# Patient Record
Sex: Female | Born: 1975 | Race: Black or African American | Hispanic: No | Marital: Married | Smoking: Never smoker
Health system: Southern US, Community
[De-identification: ages and names within clinical notes are randomized; demographics above are authoritative.]

## PROBLEM LIST (undated history)

## (undated) ENCOUNTER — Inpatient Hospital Stay (HOSPITAL_COMMUNITY): Payer: Self-pay

## (undated) DIAGNOSIS — E785 Hyperlipidemia, unspecified: Secondary | ICD-10-CM

## (undated) DIAGNOSIS — Z8742 Personal history of other diseases of the female genital tract: Secondary | ICD-10-CM

## (undated) DIAGNOSIS — O364XX0 Maternal care for intrauterine death, not applicable or unspecified: Secondary | ICD-10-CM

## (undated) DIAGNOSIS — N879 Dysplasia of cervix uteri, unspecified: Secondary | ICD-10-CM

## (undated) DIAGNOSIS — O4400 Placenta previa specified as without hemorrhage, unspecified trimester: Secondary | ICD-10-CM

## (undated) DIAGNOSIS — D649 Anemia, unspecified: Secondary | ICD-10-CM

## (undated) DIAGNOSIS — R87629 Unspecified abnormal cytological findings in specimens from vagina: Secondary | ICD-10-CM

## (undated) DIAGNOSIS — D219 Benign neoplasm of connective and other soft tissue, unspecified: Secondary | ICD-10-CM

## (undated) DIAGNOSIS — D75839 Thrombocytosis, unspecified: Secondary | ICD-10-CM

## (undated) DIAGNOSIS — D473 Essential (hemorrhagic) thrombocythemia: Secondary | ICD-10-CM

## (undated) DIAGNOSIS — N390 Urinary tract infection, site not specified: Secondary | ICD-10-CM

## (undated) HISTORY — PX: EYE SURGERY: SHX253

## (undated) HISTORY — DX: Dysplasia of cervix uteri, unspecified: N87.9

## (undated) HISTORY — PX: LASIK: SHX215

## (undated) HISTORY — DX: Maternal care for intrauterine death, not applicable or unspecified: O36.4XX0

## (undated) HISTORY — DX: Personal history of other diseases of the female genital tract: Z87.42

## (undated) HISTORY — PX: WISDOM TOOTH EXTRACTION: SHX21

---

## 2004-08-30 ENCOUNTER — Emergency Department (HOSPITAL_COMMUNITY): Admission: EM | Admit: 2004-08-30 | Discharge: 2004-08-30 | Payer: Self-pay | Admitting: Emergency Medicine

## 2005-07-18 ENCOUNTER — Other Ambulatory Visit: Admission: RE | Admit: 2005-07-18 | Discharge: 2005-07-18 | Payer: Self-pay | Admitting: Obstetrics and Gynecology

## 2005-11-12 ENCOUNTER — Inpatient Hospital Stay (HOSPITAL_COMMUNITY): Admission: AD | Admit: 2005-11-12 | Discharge: 2005-11-12 | Payer: Self-pay | Admitting: Obstetrics & Gynecology

## 2005-11-12 ENCOUNTER — Emergency Department (HOSPITAL_COMMUNITY): Admission: EM | Admit: 2005-11-12 | Discharge: 2005-11-12 | Payer: Self-pay | Admitting: Emergency Medicine

## 2005-11-13 ENCOUNTER — Inpatient Hospital Stay (HOSPITAL_COMMUNITY): Admission: AD | Admit: 2005-11-13 | Discharge: 2005-11-13 | Payer: Self-pay | Admitting: Obstetrics and Gynecology

## 2006-02-15 ENCOUNTER — Inpatient Hospital Stay (HOSPITAL_COMMUNITY): Admission: AD | Admit: 2006-02-15 | Discharge: 2006-02-18 | Payer: Self-pay | Admitting: Obstetrics and Gynecology

## 2008-02-20 ENCOUNTER — Ambulatory Visit: Payer: Self-pay | Admitting: Dermatology

## 2008-12-01 ENCOUNTER — Ambulatory Visit: Payer: Self-pay | Admitting: Physician Assistant

## 2009-02-24 ENCOUNTER — Ambulatory Visit: Payer: Self-pay | Admitting: Family Medicine

## 2009-04-16 HISTORY — PX: COLPOSCOPY: SHX161

## 2010-04-16 HISTORY — PX: LASIK: SHX215

## 2014-08-11 ENCOUNTER — Ambulatory Visit (INDEPENDENT_AMBULATORY_CARE_PROVIDER_SITE_OTHER): Payer: Self-pay | Admitting: Family Medicine

## 2014-08-11 VITALS — BP 116/74 | HR 86 | Temp 98.8°F | Resp 18 | Ht 64.0 in | Wt 139.0 lb

## 2014-08-11 DIAGNOSIS — Z029 Encounter for administrative examinations, unspecified: Secondary | ICD-10-CM

## 2014-08-11 DIAGNOSIS — Z111 Encounter for screening for respiratory tuberculosis: Secondary | ICD-10-CM

## 2014-08-11 DIAGNOSIS — IMO0002 Reserved for concepts with insufficient information to code with codable children: Secondary | ICD-10-CM

## 2014-08-11 NOTE — Progress Notes (Signed)
Subjective:  This chart was scribed for Tamara Ray, MD by [facility], medical scribe at [facility].The patient was seen in exam room 05 and the patient's care was started at 6:33 PM.   Patient ID: Tamara Lee, female    DOB: Oct 04, 1975, 39 y.o.   MRN: [id number] Chief Complaint  Patient presents with  . Annual Exam    has form    HPI HPI Comments: Tamara Lee is a 39 y.o. female who presents to [facility] for an employment physical. She needs a TB skin test, TB skin screening test is low risk. 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  There are no active problems to display for this patient.  History reviewed. No pertinent past medical history. Past Surgical History  Procedure Laterality Date  . Fracture surgery    . Cesarean section     No Known Allergies Prior to Admission medications   Not on File   History   Social History  . Marital Status: Single    Spouse Name: N/A  . Number of Children: N/A  . Years of Education: N/A   Occupational History  . Not on file.   Social History Main Topics  . Smoking status: Never Smoker   . Smokeless tobacco: Not on file  . Alcohol Use: No  . Drug Use: No  . Sexual Activity: Not on file   Other Topics Concern  . Not on file   Social History Narrative  . No narrative on file   Review of Systems 13 point ROS reviewed in patient survey, negative other than listed above or in reviewed nursing note.     Objective:  BP 116/74 mmHg  Pulse 86  Temp(Src) 98.8 F (37.1 C) (Oral)  Resp 18  Ht 5\' 4"  (1.626 m)  Wt 139 lb (63.05 kg)  BMI 23.85 kg/m2  SpO2 96%  LMP 07/07/2014 Physical Exam  Constitutional: She is oriented to person, place, and time. She appears well-developed and well-nourished. No distress.  HENT:  Head: Normocephalic and atraumatic.  Right Ear: External ear normal.  Left Ear: External  ear normal.  Mouth/Throat: Oropharynx is clear and moist.  Eyes: Conjunctivae are normal. Pupils are equal, round, and reactive to light.  Neck: Normal range of motion. Neck supple. No thyromegaly present.  Cardiovascular: Normal rate, regular rhythm, normal heart sounds and intact distal pulses.   No murmur heard. Pulmonary/Chest: Effort normal and breath sounds normal. No respiratory distress. She has no wheezes.  Abdominal: Soft. Bowel sounds are normal. There is no tenderness.  Musculoskeletal: Normal range of motion. She exhibits no edema or tenderness.       Thoracic back: Normal.       Lumbar back: Normal.  Lymphadenopathy:    She has no cervical adenopathy.  Neurological: She is alert and oriented to person, place, and time.  Skin: Skin is warm and dry. No rash noted.  Psychiatric: She has a normal mood and affect. Her behavior is normal. Thought content normal.  Nursing note and vitals reviewed.     Assessment & Plan:   Tamara Lee is a 39 y.o. female Examination for daycare employee.  -no concerns on hx or exam.  See paperwork - cleared for work.   Screening for tuberculosis - Plan: TB Skin Test obtained.  Read in 48-72 hrs.    No orders  of the defined types were placed in this encounter.   Patient Instructions  Return Saturday for tb skin test reading.     I personally performed the services described in this documentation, which was scribed in my presence. The recorded information has been reviewed and considered, and addended by me as needed.

## 2014-08-11 NOTE — Progress Notes (Signed)

## 2014-08-11 NOTE — Patient Instructions (Signed)
Return Saturday for tb skin test reading.

## 2014-08-14 ENCOUNTER — Ambulatory Visit (INDEPENDENT_AMBULATORY_CARE_PROVIDER_SITE_OTHER): Payer: Self-pay | Admitting: *Deleted

## 2014-08-14 DIAGNOSIS — Z111 Encounter for screening for respiratory tuberculosis: Secondary | ICD-10-CM

## 2014-08-14 LAB — TB SKIN TEST
INDURATION: 0 mm
TB Skin Test: NEGATIVE

## 2014-08-14 NOTE — Progress Notes (Signed)
Patient here today for TB read. Results negative 72mm.

## 2014-08-14 NOTE — Progress Notes (Deleted)
   Subjective:    Patient ID: Tamara Lee, female    DOB: 1976/04/04, 39 y.o.   MRN: [id number]  HPI    Review of Systems     Objective:   Physical Exam        Assessment & Plan:

## 2015-09-06 ENCOUNTER — Ambulatory Visit (INDEPENDENT_AMBULATORY_CARE_PROVIDER_SITE_OTHER): Payer: BLUE CROSS/BLUE SHIELD | Admitting: Physician Assistant

## 2015-09-06 VITALS — BP 107/66 | HR 84 | Temp 98.1°F | Resp 16 | Ht 64.0 in | Wt 151.0 lb

## 2015-09-06 DIAGNOSIS — J302 Other seasonal allergic rhinitis: Secondary | ICD-10-CM | POA: Diagnosis not present

## 2015-09-06 NOTE — Patient Instructions (Signed)
     IF you received an x-ray today, you will receive an invoice from [facility]. Please contact [facility] at [phone] with questions or concerns regarding your invoice.   IF you received labwork today, you will receive an invoice from [facility]. Please contact [facility] at [phone] with questions or concerns regarding your invoice.   Our billing staff will not be able to assist you with questions regarding bills from these companies.  You will be contacted with the lab results as soon as they are available. The fastest way to get your results is to activate your My Chart account. Instructions are located on the last page of this paperwork. If you have not heard from us regarding the results in 2 weeks, please contact this office.      

## 2015-09-06 NOTE — Progress Notes (Signed)
   09/06/2015 4:30 PM   DOB: [birth year] / MRN: [id number]  SUBJECTIVE:  Tamara Lee is a 40 y.o. female presenting for cough and nasal congestion that has been present for three weeks.  She is pregnant with her second child and this is a planned pregnancy.  She denies fever and is eating and drinking normally for her.   She has No Known Allergies.   She  has no past medical history on file.    She  reports that she has never smoked. She has never used smokeless tobacco. She reports that she does not drink alcohol or use illicit drugs. She  has no sexual activity history on file. The patient  has past surgical history that includes Fracture surgery and Cesarean section.  Her family history includes Hypertension in her brother and father.  Review of Systems  Constitutional: Negative for fever.  HENT: Negative for sore throat.   Cardiovascular: Negative for chest pain.  Gastrointestinal: Negative for heartburn.  Genitourinary: Negative for dysuria.  Musculoskeletal: Negative for myalgias.  Skin: Negative for itching and rash.  Neurological: Negative for dizziness and headaches.  Psychiatric/Behavioral: Negative for depression.    Problem list and medications reviewed and updated by myself where necessary, and exist elsewhere in the encounter.   OBJECTIVE:  BP 107/66 mmHg  Pulse 84  Temp(Src) 98.1 F (36.7 C) (Oral)  Resp 16  Ht 5\' 4"  (1.626 m)  Wt 151 lb (68.493 kg)  BMI 25.91 kg/m2  SpO2 99%  LMP 07/18/2015 (Exact Date)  Physical Exam  Constitutional: She is oriented to person, place, and time. She appears well-nourished. No distress.  Eyes: EOM are normal. Pupils are equal, round, and reactive to light.  Cardiovascular: Normal rate.   Pulmonary/Chest: Effort normal.  Abdominal: She exhibits no distension.  Neurological: She is alert and oriented to person, place, and time. No cranial nerve deficit. Gait normal.  Skin: Skin is dry. She is not diaphoretic.    Psychiatric: She has a normal mood and affect.  Vitals reviewed.   No results found for this or any previous visit (from the past 72 hour(s)).  No results found.  ASSESSMENT AND PLAN  [name] was seen today for sinus problem.  Diagnoses and all orders for this visit:  Seasonal allergies Comments: Advised she avoid NSAIDS.  Zyrtec or Claritin and Tylenol at OTC doses are okay.  Advised she ask OB/SYN about pseudoephedrine.    The patient was advised to call or return to clinic if she does not see an improvement in symptoms or to seek the care of the closest emergency department if she worsens with the above plan.   Philis Fendt, MHS, PA-C [facility] 09/06/2015 4:30 PM

## 2015-12-14 ENCOUNTER — Encounter (HOSPITAL_COMMUNITY): Payer: Self-pay | Admitting: *Deleted

## 2015-12-14 ENCOUNTER — Observation Stay (HOSPITAL_COMMUNITY)
Admission: AD | Admit: 2015-12-14 | Discharge: 2015-12-15 | Disposition: A | Discharge status: Home / Self Care | Payer: BLUE CROSS/BLUE SHIELD | Source: Ambulatory Visit | Attending: Obstetrics and Gynecology | Admitting: Obstetrics and Gynecology

## 2015-12-14 DIAGNOSIS — O364XX0 Maternal care for intrauterine death, not applicable or unspecified: Principal | ICD-10-CM | POA: Insufficient documentation

## 2015-12-14 DIAGNOSIS — Z3A20 20 weeks gestation of pregnancy: Secondary | ICD-10-CM | POA: Diagnosis not present

## 2015-12-14 DIAGNOSIS — Z3490 Encounter for supervision of normal pregnancy, unspecified, unspecified trimester: Secondary | ICD-10-CM

## 2015-12-14 DIAGNOSIS — O4290 Premature rupture of membranes, unspecified as to length of time between rupture and onset of labor, unspecified weeks of gestation: Secondary | ICD-10-CM | POA: Diagnosis present

## 2015-12-14 LAB — CBC
HEMATOCRIT: 30.3 % — AB (ref 36.0–46.0)
HEMOGLOBIN: 10.5 g/dL — AB (ref 12.0–15.0)
MCH: 30.1 pg (ref 26.0–34.0)
MCHC: 34.7 g/dL (ref 30.0–36.0)
MCV: 86.8 fL (ref 78.0–100.0)
Platelets: 310 10*3/uL (ref 150–400)
RBC: 3.49 MIL/uL — AB (ref 3.87–5.11)
RDW: 13.6 % (ref 11.5–15.5)
WBC: 17.4 10*3/uL — AB (ref 4.0–10.5)

## 2015-12-14 LAB — TYPE AND SCREEN
ABO/RH(D): A POS
ANTIBODY SCREEN: NEGATIVE

## 2015-12-14 LAB — OB RESULTS CONSOLE RUBELLA ANTIBODY, IGM: Rubella: IMMUNE

## 2015-12-14 LAB — OB RESULTS CONSOLE ABO/RH: RH Type: POSITIVE

## 2015-12-14 LAB — OB RESULTS CONSOLE RPR: RPR: NONREACTIVE

## 2015-12-14 LAB — OB RESULTS CONSOLE GC/CHLAMYDIA
CHLAMYDIA, DNA PROBE: NEGATIVE
GC PROBE AMP, GENITAL: NEGATIVE

## 2015-12-14 LAB — OB RESULTS CONSOLE HIV ANTIBODY (ROUTINE TESTING): HIV: NONREACTIVE

## 2015-12-14 LAB — OB RESULTS CONSOLE HEPATITIS B SURFACE ANTIGEN: Hepatitis B Surface Ag: NEGATIVE

## 2015-12-14 LAB — OB RESULTS CONSOLE ANTIBODY SCREEN: ANTIBODY SCREEN: NEGATIVE

## 2015-12-14 LAB — ABO/RH: ABO/RH(D): A POS

## 2015-12-14 MED ORDER — OXYCODONE-ACETAMINOPHEN 5-325 MG PO TABS: 1.0000 | ORAL_TABLET | ORAL | Status: DC | PRN

## 2015-12-14 MED ORDER — LACTATED RINGERS IV SOLN
INTRAVENOUS | Status: DC
  Administered 2015-12-14 – 2015-12-15 (×4): via INTRAVENOUS

## 2015-12-14 MED ORDER — LIDOCAINE HCL (PF) 1 % IJ SOLN: 30.0000 mL | INTRAMUSCULAR | Status: DC | PRN

## 2015-12-14 MED ORDER — FLEET ENEMA 7-19 GM/118ML RE ENEM: 1.0000 | ENEMA | RECTAL | Status: DC | PRN

## 2015-12-14 MED ORDER — OXYTOCIN BOLUS FROM INFUSION: 500.0000 mL | Freq: Once | INTRAVENOUS | Status: DC

## 2015-12-14 MED ORDER — ONDANSETRON HCL 4 MG/2ML IJ SOLN: 4.0000 mg | Freq: Four times a day (QID) | INTRAMUSCULAR | Status: DC | PRN

## 2015-12-14 MED ORDER — LACTATED RINGERS IV SOLN: 500.0000 mL | INTRAVENOUS | Status: DC | PRN

## 2015-12-14 MED ORDER — OXYCODONE-ACETAMINOPHEN 5-325 MG PO TABS: 2.0000 | ORAL_TABLET | ORAL | Status: DC | PRN

## 2015-12-14 MED ORDER — ACETAMINOPHEN 325 MG PO TABS: 650.0000 mg | ORAL_TABLET | ORAL | Status: DC | PRN

## 2015-12-14 MED ORDER — BUTORPHANOL TARTRATE 1 MG/ML IJ SOLN
1.0000 mg | Freq: Once | INTRAMUSCULAR | Status: AC
  Administered 2015-12-14: 1 mg via INTRAVENOUS
  Filled 2015-12-14: qty 1

## 2015-12-14 MED ORDER — MISOPROSTOL 50MCG HALF TABLET
100.0000 ug | ORAL_TABLET | ORAL | Status: AC
  Administered 2015-12-14 – 2015-12-15 (×3): 100 ug via VAGINAL
  Filled 2015-12-14 (×3): qty 1

## 2015-12-14 MED ORDER — CEFAZOLIN IN D5W 1 GM/50ML IV SOLN
1.0000 g | Freq: Three times a day (TID) | INTRAVENOUS | Status: DC
  Administered 2015-12-14 – 2015-12-15 (×3): 1 g via INTRAVENOUS
  Filled 2015-12-14 (×4): qty 50

## 2015-12-14 MED ORDER — LACTATED RINGERS IV SOLN: INTRAVENOUS | Status: DC

## 2015-12-14 MED ORDER — PROMETHAZINE HCL 25 MG/ML IJ SOLN
12.5000 mg | Freq: Once | INTRAMUSCULAR | Status: AC
  Administered 2015-12-14: 12.5 mg via INTRAVENOUS
  Filled 2015-12-14: qty 1

## 2015-12-14 MED ORDER — PROMETHAZINE HCL 25 MG PO TABS: 12.5000 mg | ORAL_TABLET | Freq: Once | ORAL | Status: AC

## 2015-12-14 MED ORDER — SOD CITRATE-CITRIC ACID 500-334 MG/5ML PO SOLN: 30.0000 mL | ORAL | Status: DC | PRN

## 2015-12-14 MED ORDER — SOD CITRATE-CITRIC ACID 500-334 MG/5ML PO SOLN
30.0000 mL | ORAL | Status: DC | PRN
  Administered 2015-12-15: 30 mL via ORAL
  Filled 2015-12-14: qty 15

## 2015-12-14 MED ORDER — PROMETHAZINE HCL 25 MG PO TABS: 12.5000 mg | ORAL_TABLET | Freq: Once | ORAL | Status: DC

## 2015-12-14 MED ORDER — OXYTOCIN 40 UNITS IN LACTATED RINGERS INFUSION - SIMPLE MED
2.5000 [IU]/h | INTRAVENOUS | Status: DC
  Filled 2015-12-14: qty 1000

## 2015-12-14 MED ORDER — OXYTOCIN 40 UNITS IN LACTATED RINGERS INFUSION - SIMPLE MED: 2.5000 [IU]/h | INTRAVENOUS | Status: DC

## 2015-12-14 NOTE — H&P (Signed)
NAME:  Tamara Lee, Tamara Lee NO.:  [id number]  MEDICAL RECORD NO.:  [id number]  LOCATION:                                 FACILITY:  PHYSICIAN:  Ralene Bathe. Matthew Saras, M.D.DATE OF BIRTH:  07-06-1975  DATE OF ADMISSION:  12/14/2015 DATE OF DISCHARGE:                             HISTORY & PHYSICAL   CHIEF COMPLAINT:  IUFD at 20+ weeks' gestation.  HPI:  A 40 year old, G2, P1, 1st pregnancy delivered by cesarean, 9 pounds 2 ounce female in 2007.  She had an uneventful pregnancy with a normal Panorama female karyotype and did not experience problems until she presented 8/28 to the office with a diagnosis of PPROM, previable. Several options discussed with her at that point.  The cervix was 1.6 cm with funneling.  AFI was way less than the 3rd percentile.  She was given options of expected management versus hospitalization and was pursuing modified rest at home checking her temp with planned follow up in the office today.  She presented 2 days later, 8/30 earlier today. Temp 98.7.  No FHR noted.  Ultrasound confirmed no FHR.  Presents now for management.  IUFD at 77 weeks.  The process of Cytotec induction explained to her.  We will go ahead and start prophylactic antibiotics, discussed further evaluation options as far as placental pathology autopsy, and chromosomes have already been performed.  We will check antibody titers for infectious causes also.  Blood type is A positive.  PAST MEDICAL HISTORY:  Please see the [facility] form for details of her PMH.  PHYSICAL EXAM:  VITAL SIGNS:  Temp 98.7, blood pressure 120/78. HEENT:  Unremarkable. NECK:  Supple without masses. LUNGS:  Clear. CARDIOVASCULAR:  Regular rate and rhythm.  No murmurs, rubs, gallops. BREASTS:  Not examined. PELVIS:  Fundus at the umbilicus.  Nontender.  No FHR noted.  Cervix will be examined in Labor and Delivery. EXTREMITIES:  Unremarkable. NEUROLOGIC:  Unremarkable  IMPRESSION:  A 20+ week  IUFD/PPROM.  PLAN:  Cytotec labor induction.     Muzammil Bruins M. Matthew Saras, M.D.   ______________________________ Ralene Bathe. Matthew Saras, M.D.    RM[facility]/MEDQ  D:  12/14/2015  T:  12/14/2015  Job:  [id number]

## 2015-12-14 NOTE — Anesthesia Pain Management Evaluation Note (Signed)
  CRNA Pain Management Visit Note  Patient: Tamara Lee, 40 y.o., female  "Hello I am a member of the anesthesia team at [facility]. We have an anesthesia team available at all times to provide care throughout the hospital, including epidural management and anesthesia for C-section. I don't know your plan for the delivery whether it a natural birth, water birth, IV sedation, nitrous supplementation, doula or epidural, but we want to meet your pain goals."   1.Was your pain managed to your expectations on prior hospitalizations?   Unable to assess at this time  2.What is your expectation for pain management during this hospitalization?     Unable to assess at this time. Per RN fetal demise and mother quite emotional right now, asking for epidural to be placed. Will forego OB Anesthesia Consult at this time and inform Anesthesiologist regarding patient request and current status.   3.How can we help you reach that goal?  Record the patient's initial score and the patient's pain goal.   Pain: Foregoing  Assessment at this time per RN request.  Pain Goal: Minimal, with epidural placement requested [facility] wants you to be able to say your pain was always managed very well.  [facility] 12/14/2015

## 2015-12-14 NOTE — Progress Notes (Signed)
I spent time with pt and her family to offer bereavement support.  Pt is emotionally stoic from a place of strength.  Her parents were with her and her husband had just left to pick up their 40 year-old son.  I was in the room when she talked on the phone to her son and told him that his baby sister "wasn't going to make it." He was also very stoic on the phone. I offered pastoral presence and reflective listening.  They are aware of our ongoing availability for support, but please also page as needs arise.   [place], Bcc Pager, [phone] 5:25 PM    12/14/15 1700  Clinical Encounter Type  Visited With Patient and family together  Visit Type Initial  Referral From Nurse  Consult/Referral To Chaplain

## 2015-12-14 NOTE — H&P (Signed)
Tamara Lee  DICTATION # [id number] CSN# [id number]   Margarette Asal, MD 12/14/2015 1:42 PM

## 2015-12-15 ENCOUNTER — Observation Stay (HOSPITAL_COMMUNITY): Payer: BLUE CROSS/BLUE SHIELD | Admitting: Anesthesiology

## 2015-12-15 ENCOUNTER — Encounter (HOSPITAL_COMMUNITY): Payer: Self-pay | Admitting: Anesthesiology

## 2015-12-15 ENCOUNTER — Encounter (HOSPITAL_COMMUNITY)
Admission: AD | Disposition: A | Discharge status: Home / Self Care | Payer: Self-pay | Source: Ambulatory Visit | Attending: Obstetrics and Gynecology

## 2015-12-15 DIAGNOSIS — O364XX0 Maternal care for intrauterine death, not applicable or unspecified: Secondary | ICD-10-CM | POA: Diagnosis not present

## 2015-12-15 HISTORY — PX: DILATION AND CURETTAGE OF UTERUS: SHX78

## 2015-12-15 HISTORY — DX: Maternal care for intrauterine death, not applicable or unspecified: O36.4XX0

## 2015-12-15 LAB — INFECT DISEASE AB IGM REFLEX 1

## 2015-12-15 LAB — TORCH-IGM(TOXO/ RUB/ CMV/ HSV) W TITER
CMV IgM: <30 AU/mL (ref 0.0–29.9)
Rubella IgM: <20 AU/mL (ref 0.0–19.9)
Toxoplasma Antibody- IgM: <3 AU/mL (ref 0.0–7.9)

## 2015-12-15 LAB — RPR: RPR: NONREACTIVE

## 2015-12-15 SURGERY — DILATION AND CURETTAGE
Anesthesia: Choice

## 2015-12-15 SURGERY — DILATION AND CURETTAGE
Anesthesia: Spinal | Site: Vagina

## 2015-12-15 MED ORDER — FENTANYL CITRATE (PF) 100 MCG/2ML IJ SOLN
INTRAMUSCULAR | Status: DC | PRN
  Administered 2015-12-15 (×2): 50 ug via INTRAVENOUS

## 2015-12-15 MED ORDER — MIDAZOLAM HCL 5 MG/5ML IJ SOLN
INTRAMUSCULAR | Status: DC | PRN
  Administered 2015-12-15: 2 mg via INTRAVENOUS

## 2015-12-15 MED ORDER — IBUPROFEN 600 MG PO TABS
600.0000 mg | ORAL_TABLET | Freq: Once | ORAL | Status: AC
  Administered 2015-12-15: 600 mg via ORAL

## 2015-12-15 MED ORDER — IBUPROFEN 600 MG PO TABS: 600.0000 mg | ORAL_TABLET | Freq: Once | ORAL | 0 refills | Status: AC

## 2015-12-15 MED ORDER — PROMETHAZINE HCL 25 MG/ML IJ SOLN: 6.2500 mg | INTRAMUSCULAR | Status: DC | PRN

## 2015-12-15 MED ORDER — SODIUM CHLORIDE 0.9% FLUSH
INTRAVENOUS | Status: AC
  Administered 2015-12-15: 6 mL
  Filled 2015-12-15: qty 6

## 2015-12-15 MED ORDER — DEXAMETHASONE SODIUM PHOSPHATE 4 MG/ML IJ SOLN
INTRAMUSCULAR | Status: AC
  Filled 2015-12-15: qty 1

## 2015-12-15 MED ORDER — OXYTOCIN 10 UNIT/ML IJ SOLN
INTRAMUSCULAR | Status: AC
  Filled 2015-12-15: qty 4

## 2015-12-15 MED ORDER — ONDANSETRON HCL 4 MG/2ML IJ SOLN
INTRAMUSCULAR | Status: AC
  Filled 2015-12-15: qty 2

## 2015-12-15 MED ORDER — ONDANSETRON HCL 4 MG/2ML IJ SOLN
INTRAMUSCULAR | Status: DC | PRN
  Administered 2015-12-15: 4 mg via INTRAVENOUS

## 2015-12-15 MED ORDER — OXYTOCIN 10 UNIT/ML IJ SOLN
INTRAMUSCULAR | Status: DC | PRN
  Administered 2015-12-15: 40 [IU] via INTRAVENOUS

## 2015-12-15 MED ORDER — MIDAZOLAM HCL 2 MG/2ML IJ SOLN
INTRAMUSCULAR | Status: AC
  Filled 2015-12-15: qty 2

## 2015-12-15 MED ORDER — BUTORPHANOL TARTRATE 1 MG/ML IJ SOLN
1.0000 mg | Freq: Once | INTRAMUSCULAR | Status: DC
  Filled 2015-12-15: qty 1

## 2015-12-15 MED ORDER — LIDOCAINE HCL 1 % IJ SOLN
INTRAMUSCULAR | Status: AC
  Filled 2015-12-15: qty 20

## 2015-12-15 MED ORDER — LACTATED RINGERS IV SOLN
INTRAVENOUS | Status: DC | PRN
  Administered 2015-12-15: 07:00:00 via INTRAVENOUS

## 2015-12-15 MED ORDER — FENTANYL CITRATE (PF) 100 MCG/2ML IJ SOLN
INTRAMUSCULAR | Status: AC
  Filled 2015-12-15: qty 2

## 2015-12-15 MED ORDER — FENTANYL CITRATE (PF) 100 MCG/2ML IJ SOLN: 25.0000 ug | INTRAMUSCULAR | Status: DC | PRN

## 2015-12-15 MED ORDER — IBUPROFEN 600 MG PO TABS
ORAL_TABLET | ORAL | Status: AC
  Filled 2015-12-15: qty 1

## 2015-12-15 MED ORDER — DEXAMETHASONE SODIUM PHOSPHATE 4 MG/ML IJ SOLN
INTRAMUSCULAR | Status: DC | PRN
  Administered 2015-12-15: 4 mg via INTRAVENOUS

## 2015-12-15 SURGICAL SUPPLY — 14 items
ADAPTER VACURETTE TBG SET 14 (CANNULA) ×2 IMPLANT
CLOTH BEACON ORANGE TIMEOUT ST (SAFETY) ×3 IMPLANT
CONT PATH 16OZ SNAP LID 3702 (MISCELLANEOUS) ×3 IMPLANT
GLOVE BIO SURGEON STRL SZ7 (GLOVE) ×3 IMPLANT
GLOVE BIOGEL PI IND STRL 7.0 (GLOVE) ×2 IMPLANT
GLOVE BIOGEL PI INDICATOR 7.0 (GLOVE) ×1
GOWN STRL REUS W/TWL LRG LVL3 (GOWN DISPOSABLE) ×6 IMPLANT
KIT BERKELEY 2ND TRIMESTER 1/2 (COLLECTOR) ×3 IMPLANT
NS IRRIG 1000ML POUR BTL (IV SOLUTION) ×3 IMPLANT
PACK VAGINAL MINOR WOMEN LF (CUSTOM PROCEDURE TRAY) ×3 IMPLANT
PAD OB MATERNITY 4.3X12.25 (PERSONAL CARE ITEMS) ×3 IMPLANT
PAD PREP 24X48 CUFFED NSTRL (MISCELLANEOUS) ×3 IMPLANT
TOWEL OR 17X24 6PK STRL BLUE (TOWEL DISPOSABLE) ×6 IMPLANT
TUBE VACURETTE 2ND TRIMESTER (CANNULA) ×3 IMPLANT

## 2015-12-15 NOTE — Discharge Instructions (Addendum)
DISCHARGE INSTRUCTIONS: D&C / D&E The following instructions have been prepared to help you care for yourself upon your return home.   Personal hygiene:  Use sanitary pads for vaginal drainage, not tampons.  Shower the day after your procedure.  NO tub baths, pools or Jacuzzis for 2-3 weeks.  Wipe front to back after using the bathroom.  Activity and limitations:  Do NOT drive or operate any equipment for 24 hours. The effects of anesthesia are still present and drowsiness may result.  Do NOT rest in bed all day.  Walking is encouraged.  Walk up and down stairs slowly.  You may resume your normal activity in one to two days or as indicated by your physician.  Sexual activity: NO intercourse for at least 2 weeks after the procedure, or as indicated by your physician.  Diet: Eat a light meal as desired this evening. You may resume your usual diet tomorrow.  Return to work: You may resume your work activities in one to two days or as indicated by your doctor.  What to expect after your surgery: Expect to have vaginal bleeding/discharge for 2-3 days and spotting for up to 10 days. It is not unusual to have soreness for up to 1-2 weeks. You may have a slight burning sensation when you urinate for the first day. Mild cramps may continue for a couple of days. You may have a regular period in 2-6 weeks.  Call your doctor for any of the following:  Excessive vaginal bleeding, saturating and changing one pad every hour.  Inability to urinate 6 hours after discharge from hospital.  Pain not relieved by pain medication.  Fever of 100.4 F or greater.  Unusual vaginal discharge or odor.   Call for an appointment:FOR  FOLLOW UP IN 1-2 WEEKS   Patients signature: ______________________  Nurses signature ________________________  Support person's signature_______________________    Post Anesthesia Home Care Instructions  Activity: Get plenty of rest for the remainder of  the day. A responsible adult should stay with you for 24 hours following the procedure.  For the next 24 hours, DO NOT: -Drive a car -Paediatric nurse -Drink alcoholic beverages -Take any medication unless instructed by your physician -Make any legal decisions or sign important papers.  Meals: Start with liquid foods such as gelatin or soup. Progress to regular foods as tolerated. Avoid greasy, spicy, heavy foods. If nausea and/or vomiting occur, drink only clear liquids until the nausea and/or vomiting subsides. Call your physician if vomiting continues.  Special Instructions/Symptoms: Your throat may feel dry or sore from the anesthesia or the breathing tube placed in your throat during surgery. If this causes discomfort, gargle with warm salt water. The discomfort should disappear within 24 hours.

## 2015-12-15 NOTE — Progress Notes (Signed)
Still unable to del placenta>>rec D and E>>proced + risks reviewed w/ her

## 2015-12-15 NOTE — Anesthesia Procedure Notes (Signed)
Spinal  Patient location during procedure: OR Staffing Performed: anesthesiologist  Preanesthetic Checklist Completed: patient identified, site marked, surgical consent, pre-op evaluation, timeout performed, IV checked, risks and benefits discussed and monitors and equipment checked Spinal Block Patient position: sitting Prep: Betadine Patient monitoring: heart rate, continuous pulse ox and blood pressure Injection technique: single-shot Needle Needle type: Sprotte  Needle gauge: 24 G Needle length: 9 cm Additional Notes Expiration date of kit checked and confirmed. Patient tolerated procedure well, without complications.       

## 2015-12-15 NOTE — Progress Notes (Signed)
Del SB female, grossly WNL, plac not immediately coming out, min bleeding, will recheck 30 min to see if plac will del spont, pt requests autopsy as discussed previosly.

## 2015-12-15 NOTE — Op Note (Signed)
Preoperative diagnosis: Retained placenta after 20 week pregnancy loss  Postoperative diagnosis: Same  Procedure: D&E  Surgeon: Matthew Saras  Anesthesia: Spinal  EBL: 200 cc  Procedure and findings:  Patient had a retained placenta with only minimal bleeding after spontaneous delivery of a 20 week induced IUFD. She was taken to the operating room spinal anesthetic was obtained appropriate timeouts were taken with the legs in stirrups the perineum and vagina were prepped and draped bladder was drained. Initial traction on the cord with ring forcep of did not produce any of the placenta the placenta. A #14 suction curet was then used to curette a large amount of tissue, this was followed by 1 large portion of placenta which was removed in toto. Large elephant curet was then used to gently curette the cavity revealing the walls to be clean and firm there was minimal bleeding she tolerated this well went to recovery room in good condition.  Dictated with GoldenD.

## 2015-12-15 NOTE — Anesthesia Preprocedure Evaluation (Signed)
Anesthesia Evaluation  Patient identified by MRN, date of birth, ID band Patient awake    Reviewed: Allergy & Precautions, NPO status , Patient's Chart, lab work & pertinent test results  Airway Mallampati: II  TM Distance: >3 FB Neck ROM: Full    Dental no notable dental hx.    Pulmonary neg pulmonary ROS,    Pulmonary exam normal breath sounds clear to auscultation       Cardiovascular negative cardio ROS Normal cardiovascular exam Rhythm:Regular Rate:Normal     Neuro/Psych negative neurological ROS  negative psych ROS   GI/Hepatic negative GI ROS, Neg liver ROS,   Endo/Other  negative endocrine ROS  Renal/GU negative Renal ROS  negative genitourinary   Musculoskeletal negative musculoskeletal ROS (+)   Abdominal   Peds negative pediatric ROS (+)  Hematology negative hematology ROS (+)   Anesthesia Other Findings   Reproductive/Obstetrics (+) Pregnancy                             Anesthesia Physical Anesthesia Plan  ASA: II and emergent  Anesthesia Plan: Spinal   Post-op Pain Management:    Induction:   Airway Management Planned: Natural Airway  Additional Equipment:   Intra-op Plan:   Post-operative Plan:   Informed Consent: I have reviewed the patients History and Physical, chart, labs and discussed the procedure including the risks, benefits and alternatives for the proposed anesthesia with the patient or authorized representative who has indicated his/her understanding and acceptance.   Dental advisory given  Plan Discussed with: CRNA  Anesthesia Plan Comments:         Anesthesia Quick Evaluation

## 2015-12-15 NOTE — Progress Notes (Signed)
Discussed option with patient to push or to wait until pressure felt.  Pt agreed to attempt SVE.  Fetal part felt.  Pt with no pressure to push but bleeding and fetus felt in vagina.  Pt agreed to attempt to push.

## 2015-12-15 NOTE — Discharge Summary (Signed)
Obstetric Discharge Summary Reason for Admission: rupture of membranes and IUFD Prenatal Procedures: ultrasound Intrapartum Procedures: spontaneous vaginal delivery Postpartum Procedures: D&E Complications-Operative and Postpartum: none Hemoglobin  Date Value Ref Range Status  12/14/2015 10.5 (L) 12.0 - 15.0 g/dL Final   HCT  Date Value Ref Range Status  12/14/2015 30.3 (L) 36.0 - 46.0 % Final    Physical Exam:  General: alert and cooperative Lochia: appropriate Uterine Fundus: firm Incision: n/a DVT Evaluation: No evidence of DVT seen on physical exam.  Discharge Diagnoses: IUFD @ 20wks  Discharge Information: Date: 12/15/2015 Activity: pelvic rest Diet: routine Medications: PNV and Ibuprofen Condition: stable Instructions:follow up in office in 1-2 wks Discharge to: home   Newborn Data: Live born female  Birth Weight: 9.2 oz (260 g) APGAR: 0,   Home with for autopsy.  Tamara Lee 12/15/2015, 10:15 AM

## 2015-12-15 NOTE — Transfer of Care (Signed)
Immediate Anesthesia Transfer of Care Note  Patient: Tamara Lee  Procedure(s) Performed: Procedure(s): DILATATION AND CURETTAGE (N/A)  Patient Location: PACU  Anesthesia Type:Spinal  Level of Consciousness: awake, alert  and oriented  Airway & Oxygen Therapy: Patient Spontanous Breathing  Post-op Assessment: Report given to RN and Post -op Vital signs reviewed and stable  Post vital signs: Reviewed and stable  Last Vitals:  Vitals:   12/15/15 0423 12/15/15 0600  BP: (!) 101/58   Pulse: 91   Resp: 18 18  Temp: 36.9 C     Last Pain:  Vitals:   12/15/15 0423  TempSrc: Oral  PainSc: 0-No pain      Patients Stated Pain Goal: 0 ([id number])  Complications: No apparent anesthesia complications

## 2015-12-15 NOTE — Addendum Note (Signed)
Addendum  created 12/15/15 [id number] by Myrtie Soman, MD   Order sets accessed

## 2015-12-15 NOTE — Anesthesia Postprocedure Evaluation (Signed)
Anesthesia Post Note  Patient: Tamara Lee  Procedure(s) Performed: Procedure(s) (LRB): DILATATION AND CURETTAGE (N/A)  Patient location during evaluation: PACU Anesthesia Type: Spinal Level of consciousness: oriented and awake and alert Pain management: pain level controlled Vital Signs Assessment: post-procedure vital signs reviewed and stable Respiratory status: spontaneous breathing, respiratory function stable and patient connected to nasal cannula oxygen Cardiovascular status: blood pressure returned to baseline and stable Postop Assessment: no headache and no backache Anesthetic complications: no    Last Vitals:  Vitals:   12/15/15 0745 12/15/15 0758  BP:  (!) 78/51  Pulse:    Resp:    Temp: 36.7 C     Last Pain:  Vitals:   12/15/15 0750  TempSrc:   PainSc: 0-No pain                 Leone Putman S

## 2015-12-18 ENCOUNTER — Encounter (HOSPITAL_COMMUNITY): Payer: Self-pay | Admitting: Obstetrics and Gynecology

## 2015-12-21 LAB — PARVOVIRUS B19 ANTIBODY, IGG AND IGM
PAROVIRUS B19 IGG ABS: 0.2 {index} (ref 0.0–0.8)
PAROVIRUS B19 IGM ABS: 0.2 {index} (ref 0.0–0.8)

## 2016-08-22 ENCOUNTER — Ambulatory Visit: Payer: BLUE CROSS/BLUE SHIELD | Admitting: Physician Assistant

## 2016-09-07 ENCOUNTER — Ambulatory Visit (INDEPENDENT_AMBULATORY_CARE_PROVIDER_SITE_OTHER): Payer: BLUE CROSS/BLUE SHIELD | Admitting: Certified Nurse Midwife

## 2016-09-07 ENCOUNTER — Encounter: Payer: Self-pay | Admitting: Certified Nurse Midwife

## 2016-09-07 VITALS — BP 122/82 | Ht 64.0 in | Wt 152.0 lb

## 2016-09-07 DIAGNOSIS — Z1329 Encounter for screening for other suspected endocrine disorder: Secondary | ICD-10-CM | POA: Diagnosis not present

## 2016-09-07 DIAGNOSIS — Z113 Encounter for screening for infections with a predominantly sexual mode of transmission: Secondary | ICD-10-CM

## 2016-09-07 DIAGNOSIS — E559 Vitamin D deficiency, unspecified: Secondary | ICD-10-CM | POA: Diagnosis not present

## 2016-09-07 DIAGNOSIS — Z87410 Personal history of cervical dysplasia: Secondary | ICD-10-CM

## 2016-09-07 DIAGNOSIS — Z13220 Encounter for screening for lipoid disorders: Secondary | ICD-10-CM

## 2016-09-07 DIAGNOSIS — Z124 Encounter for screening for malignant neoplasm of cervix: Secondary | ICD-10-CM

## 2016-09-07 NOTE — Progress Notes (Signed)
Gynecology Annual Exam  PCP: Patient, No Pcp Per  Chief Complaint:  Chief Complaint  Patient presents with  . Gynecologic Exam    History of Present Illness:Tamara Lee is a 41 year old African American/Black female, G2 P1101, who presents for her exam. She is having no significant GYN problems.  Her menses are regular and her LMP was 08/18/2016. They occur every 1 month, they last 6 days, are medium flow, and are without clots.  She has had no spotting.  She reports dysmenorrhea. She uses heating pad and Excedrin with symptomatic relief.  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  Since her last annual GYN exam dated 08/05/2015, she has had a second trimester loss. She had PPROM at 20 weeks, then 2 days later there was no FCA found. She underwent an IOL and delivered a stillborn female infant on 12/15/2015. She had been trying to conceive x 4 years before achieving that pregnancy. She is wanting to have another baby and is not using anything for contraception. .  Her most recent pap smear was obtained 08/05/2015 and was with negative cells and negative HPV DNA.  Had a mammogram a few years ago for work up for possible breast mass. She does do monthly self breast exams There is no family history of breast cancer.  There is no family history of ovarian cancer.    The patient does not smoke.  The patient does not drink alcohol.  The patient does not use illegal drugs.  The patient exercises regularly.  The patient does not get adequate calcium in her diet.  She had a recent cholesterol screen in 2017 that was normal.  The patient denies current symptoms of depression.    Review of Systems: Review of Systems  Constitutional: Negative for chills, fever and weight loss.  HENT: Negative for congestion, sinus pain and sore throat.   Eyes: Negative for blurred vision and pain.  Respiratory: Negative for hemoptysis, shortness of breath and wheezing.     Cardiovascular: Negative for chest pain, palpitations and leg swelling.  Gastrointestinal: Negative for abdominal pain, blood in stool, diarrhea, heartburn, nausea and vomiting.  Genitourinary: Negative for dysuria, frequency, hematuria and urgency.  Musculoskeletal: Negative for back pain, joint pain and myalgias.  Skin: Negative for itching and rash.  Neurological: Negative for dizziness, tingling and headaches.  Endo/Heme/Allergies: Negative for environmental allergies and polydipsia. Does not bruise/bleed easily.       Negative for hirsutism   Psychiatric/Behavioral: Negative for depression. The patient is not nervous/anxious and does not have insomnia.     Past Medical History:  Past Medical History:  Diagnosis Date  . Cervical dysplasia    CIN 1  . History of abnormal cervical Pap smear   . IUFD at 87 weeks or more of gestation 12/15/2015   PPROM at 2 weeks followed by IUFD    Past Surgical History:  Past Surgical History:  Procedure Laterality Date  . CESAREAN SECTION    . COLPOSCOPY  2011   Dr. Ferne Reus CIN 1 +HPV  . DILATION AND CURETTAGE OF UTERUS N/A 12/15/2015   Procedure: DILATATION AND CURETTAGE;  Surgeon: Molli Posey, MD;  Location: [facility];  Service: Gynecology;  Laterality: N/A; for retained placenta after 20 week loss  . FRACTURE SURGERY      Family History:  Family History  Problem Relation Age of Onset  . Hypertension Mother   . Hypertension Father   .  Hypertension Brother   . Diabetes Paternal Aunt   . Colon cancer Cousin        paternal    Social History:  Social History   Social History  . Marital status: Married    Spouse name: N/A  . Number of children: 1  . Years of education: N/A   Occupational History  . works at [employer] Topics  . Smoking status: Never Smoker  . Smokeless tobacco: Never Used  . Alcohol use No  . Drug use: No  . Sexual activity: Yes    Birth control/ protection: None   Other Topics  Concern  . Not on file   Social History Narrative  . No narrative on file    Allergies:  No Known Allergies  Medications: Prior to Admission medications   Medication Sig Start Date End Date Taking? Authorizing Provider  Multiple Vitamin (MULTIVITAMIN) tablet Take 1 tablet by mouth daily.   Yes [provider]    Physical Exam Vitals: Blood pressure 122/82, height 5\' 4"  (1.626 m), weight 152 lb (68.9 kg), BMI 26.09 kg/m2 last menstrual period 08/18/2016.  General: NAD HEENT: normocephalic, anicteric Neck: no thyroid enlargement, no palpable nodules, no cervical lymphadenopathy  Pulmonary: No increased work of breathing, CTAB Cardiovascular: RRR, without murmur  Breast: Breast symmetrical, no tenderness, no palpable nodules or masses, no skin or nipple retraction present, no nipple discharge.  No axillary, infraclavicular or supraclavicular lymphadenopathy. Abdomen: Soft, non-tender, non-distended.  Umbilicus without lesions.  No hepatomegaly or masses palpable. No evidence of hernia. Genitourinary:  External: Normal external female genitalia.  Normal urethral meatus, normal  Bartholin's and Skene's glands.    Vagina: Normal vaginal mucosa, no evidence of prolapse.    Cervix: Grossly normal in appearance, no bleeding, non-tender  Uterus: Anteverted, dextrorotated; normal size, shape, and consistency; mobile, and non-tender  Adnexa: No adnexal masses, non-tender  Rectal: deferred  Lymphatic: no evidence of inguinal lymphadenopathy Extremities: no edema, erythema, or tenderness Neurologic: Grossly intact Psychiatric: mood appropriate, affect full     Assessment: 41 y.o. G2P1001 well woman gyn exam  Plan:    1) Breast cancer screening - recommend monthly self breast exam. Recommend annual mammograms. Lives in [place] make appt in [place].  2) STI screening was offered and accepted.  3) Cervical cancer screening - Pap was done. (Paptima ordered to also  screen for STD-had Trich last year)  4) Encouraged seeing an infertility specialist if desires to conceive soon in light of her age  77) Routine healthcare maintenance including cholesterol screening ordered today   Dalia Heading, CNM

## 2016-09-11 ENCOUNTER — Ambulatory Visit (INDEPENDENT_AMBULATORY_CARE_PROVIDER_SITE_OTHER): Payer: BLUE CROSS/BLUE SHIELD | Admitting: Family Medicine

## 2016-09-11 ENCOUNTER — Encounter: Payer: Self-pay | Admitting: Family Medicine

## 2016-09-11 VITALS — BP 126/78 | HR 75 | Temp 98.4°F | Resp 16 | Ht 64.25 in | Wt 151.6 lb

## 2016-09-11 DIAGNOSIS — R6884 Jaw pain: Secondary | ICD-10-CM

## 2016-09-11 NOTE — Progress Notes (Signed)
  Chief Complaint  Patient presents with  . Jaw Pain    1 week    HPI   Pt reports a week of left lower jaw pain States that her pain appeared out of the blue.  She denies any cavities She states that she looked it up online and google said "I have cancer." She denies weight loss, night sweats, swollen glands, fever or chills She denies history of grinding teeth or chewing ice.    Past Medical History:  Diagnosis Date  . Cervical dysplasia    CIN 1  . History of abnormal cervical Pap smear   . IUFD at 27 weeks or more of gestation 12/15/2015   PPROM at 20 weeks followed by IUFD    Current Outpatient Prescriptions  Medication Sig Dispense Refill  . Multiple Vitamin (MULTIVITAMIN) tablet Take 1 tablet by mouth daily.     No current facility-administered medications for this visit.     Allergies: No Known Allergies  Past Surgical History:  Procedure Laterality Date  . CESAREAN SECTION    . COLPOSCOPY  2011   Dr. Ferne Reus CIN 1 +HPV  . DILATION AND CURETTAGE OF UTERUS N/A 12/15/2015   Procedure: DILATATION AND CURETTAGE;  Surgeon: Molli Posey, MD;  Location: [facility];  Service: Gynecology;  Laterality: N/A; for retained placenta after 20 week loss  . FRACTURE SURGERY      Social History   Social History  . Marital status: Married    Spouse name: N/A  . Number of children: 1  . Years of education: N/A   Occupational History  . works at [employer] Topics  . Smoking status: Never Smoker  . Smokeless tobacco: Never Used  . Alcohol use No  . Drug use: No  . Sexual activity: Yes    Birth control/ protection: None   Other Topics Concern  . None   Social History Narrative  . None    ROS See hpi  Objective: Vitals:   09/11/16 1550  BP: 126/78  Pulse: 75  Resp: 16  Temp: 98.4 F (36.9 C)  TempSrc: Oral  SpO2: 100%  Weight: 151 lb 9.6 oz (68.8 kg)  Height: 5' 4.25" (1.632 m)    Physical Exam  Constitutional: She appears  well-developed and well-nourished.  HENT:  Head: Normocephalic and atraumatic.  Eyes: Conjunctivae and EOM are normal.  Neck: Normal range of motion. Neck supple. No tracheal deviation present.  Cardiovascular: Normal rate, regular rhythm and normal heart sounds.   Pulmonary/Chest: Effort normal and breath sounds normal. No respiratory distress.   TMJ on left with mild crepitus, no clunking or clicking No palpable lymph nodes No tenderness along the jaw line  Assessment and Plan Shadee was seen today for jaw pain.  Diagnoses and all orders for this visit:  Pain in lower jaw- advised pt to follow up with dentist for imaging and evaluation  Advised not to chew ice, gum  To take small bites at a time   A total of 15 minutes were spent face-to-face with the patient during this encounter and over half of that time was spent on counseling and coordination of care.   [place]

## 2016-09-11 NOTE — Patient Instructions (Addendum)
IF you received an x-ray today, you will receive an invoice from [facility]. Please contact [facility] at [phone] with questions or concerns regarding your invoice.   IF you received labwork today, you will receive an invoice from [facility]. Please contact [facility] at [phone] with questions or concerns regarding your invoice.   Our billing staff will not be able to assist you with questions regarding bills from these companies.  You will be contacted with the lab results as soon as they are available. The fastest way to get your results is to activate your My Chart account. Instructions are located on the last page of this paperwork. If you have not heard from [place] regarding the results in 2 weeks, please contact this office.     fc Temporomandibular Joint Syndrome Temporomandibular joint (TMJ) syndrome is a condition that affects the joints between your jaw and your skull. The TMJs are located near your ears and allow your jaw to open and close. These joints and the nearby muscles are involved in all movements of the jaw. People with TMJ syndrome have pain in the area of these joints and muscles. Chewing, biting, or other movements of the jaw can be difficult or painful. TMJ syndrome can be caused by various things. In many cases, the condition is mild and goes away within a few weeks. For some people, the condition can become a long-term problem. What are the causes? Possible causes of TMJ syndrome include:  Grinding your teeth or clenching your jaw. Some people do this when they are under stress.  Arthritis.  Injury to the jaw.  Head or neck injury.  Teeth or dentures that are not aligned well. In some cases, the cause of TMJ syndrome may not be known. What are the signs or symptoms? The most common symptom is an aching pain on the side of the head in the area of the TMJ. Other symptoms may include:  Pain when moving your jaw, such as when chewing or  biting.  Being unable to open your jaw all the way.  Making a clicking sound when you open your mouth.  Headache.  Earache.  Neck or shoulder pain. How is this diagnosed? Diagnosis can usually be made based on your symptoms, your medical history, and a physical exam. Your health care provider may check the range of motion of your jaw. Imaging tests, such as X-rays or an MRI, are sometimes done. You may need to see your dentist to determine if your teeth and jaw are lined up correctly. How is this treated? TMJ syndrome often goes away on its own. If treatment is needed, the options may include:  Eating soft foods and applying ice or heat.  Medicines to relieve pain or inflammation.  Medicines to relax the muscles.  A splint, bite plate, or mouthpiece to prevent teeth grinding or jaw clenching.  Relaxation techniques or counseling to help reduce stress.  Transcutaneous electrical nerve stimulation (TENS). This helps to relieve pain by applying an electrical current through the skin.  Acupuncture. This is sometimes helpful to relieve pain.  Jaw surgery. This is rarely needed. Follow these instructions at home:  Take medicines only as directed by your health care provider.  Eat a soft diet if you are having trouble chewing.  Apply ice to the painful area.  Put ice in a plastic bag.  Place a towel between your skin and the bag.  Leave the ice on for 20 minutes, 2-3 times a  day.  Apply a warm compress to the painful area as directed.  Massage your jaw area and perform any jaw stretching exercises as recommended by your health care provider.  If you were given a mouthpiece or bite plate, wear it as directed.  Avoid foods that require a lot of chewing. Do not chew gum.  Keep all follow-up visits as directed by your health care provider. This is important. Contact a health care provider if:  You are having trouble eating.  You have new or worsening symptoms. Get help  right away if:  Your jaw locks open or closed. This information is not intended to replace advice given to you by your health care provider. Make sure you discuss any questions you have with your health care provider. Document Released: 12/26/2000 Document Revised: 12/01/2015 Document Reviewed: 11/05/2013 Elsevier Interactive Patient Education  2017 [employer].

## 2016-09-12 LAB — LIPID PANEL WITH LDL/HDL RATIO
Cholesterol, Total: 227 mg/dL — ABNORMAL HIGH (ref 100–199)
HDL: 61 mg/dL (ref >39)
LDL Calculated: 152 mg/dL — ABNORMAL HIGH (ref 0–99)
LDl/HDL Ratio: 2.5 ratio (ref 0.0–3.2)
TRIGLYCERIDES: 72 mg/dL (ref 0–149)
VLDL Cholesterol Cal: 14 mg/dL (ref 5–40)

## 2016-09-12 LAB — TSH

## 2016-09-12 LAB — VITAMIN D 25 HYDROXY (VIT D DEFICIENCY, FRACTURES)

## 2016-09-13 LAB — PAP IG, CT-NG, RFX HPV ALL
Chlamydia, Nuc. Acid Amp: NEGATIVE
GONOCOCCUS BY NUCLEIC ACID AMP: NEGATIVE
PAP Smear Comment: 0

## 2016-09-27 ENCOUNTER — Encounter: Payer: Self-pay | Admitting: Certified Nurse Midwife

## 2016-10-22 ENCOUNTER — Telehealth: Payer: Self-pay | Admitting: Certified Nurse Midwife

## 2016-10-22 ENCOUNTER — Telehealth: Payer: Self-pay

## 2016-10-22 NOTE — Telephone Encounter (Signed)
Left message apologizing for not ordering correct pap to test for Trichimonas. Advised to make an appointment to see me for Trichimonas test and if she desires can get labs done at the same time.

## 2016-10-22 NOTE — Telephone Encounter (Signed)
Pt aware pap results normal but she is concerned that she was not tested for trichomonas per her request. Note does not indicate wet prep done to test for this in the office. Advised I would have CLG review and advise. Pt also aware of elevated cholesterol but unsure if testing for TSH and vitamin D need to be repeated as there was not enough sample collected to complete these test. Cb# [id number] thank you.

## 2016-10-22 NOTE — Telephone Encounter (Signed)
Called patient and left message to make an appointment with me for testing for Trich and if she desires labs. Apologized for not ordering correct Pap

## 2016-10-22 NOTE — Telephone Encounter (Signed)
Pt calling for results from her last visit.  [phone]

## 2017-04-02 LAB — OB RESULTS CONSOLE GC/CHLAMYDIA
Chlamydia: NEGATIVE
Gonorrhea: NEGATIVE

## 2017-04-02 LAB — OB RESULTS CONSOLE ABO/RH: RH TYPE: POSITIVE

## 2017-04-02 LAB — OB RESULTS CONSOLE HIV ANTIBODY (ROUTINE TESTING): HIV: NONREACTIVE

## 2017-04-02 LAB — OB RESULTS CONSOLE RUBELLA ANTIBODY, IGM: Rubella: IMMUNE

## 2017-04-02 LAB — OB RESULTS CONSOLE ANTIBODY SCREEN: ANTIBODY SCREEN: NEGATIVE

## 2017-04-02 LAB — OB RESULTS CONSOLE HEPATITIS B SURFACE ANTIGEN: Hepatitis B Surface Ag: NEGATIVE

## 2017-04-02 LAB — OB RESULTS CONSOLE RPR: RPR: NONREACTIVE

## 2017-04-09 ENCOUNTER — Inpatient Hospital Stay (HOSPITAL_COMMUNITY)
Admission: AD | Admit: 2017-04-09 | Discharge: 2017-04-09 | Disposition: A | Discharge status: Home / Self Care | Payer: BLUE CROSS/BLUE SHIELD | Source: Ambulatory Visit | Attending: Obstetrics and Gynecology | Admitting: Obstetrics and Gynecology

## 2017-04-09 ENCOUNTER — Encounter (HOSPITAL_COMMUNITY): Payer: Self-pay

## 2017-04-09 DIAGNOSIS — Z3A08 8 weeks gestation of pregnancy: Secondary | ICD-10-CM | POA: Insufficient documentation

## 2017-04-09 DIAGNOSIS — N898 Other specified noninflammatory disorders of vagina: Secondary | ICD-10-CM | POA: Diagnosis present

## 2017-04-09 DIAGNOSIS — B373 Candidiasis of vulva and vagina: Secondary | ICD-10-CM | POA: Diagnosis not present

## 2017-04-09 DIAGNOSIS — O2341 Unspecified infection of urinary tract in pregnancy, first trimester: Secondary | ICD-10-CM | POA: Diagnosis not present

## 2017-04-09 DIAGNOSIS — O26891 Other specified pregnancy related conditions, first trimester: Secondary | ICD-10-CM | POA: Diagnosis not present

## 2017-04-09 DIAGNOSIS — B3731 Acute candidiasis of vulva and vagina: Secondary | ICD-10-CM

## 2017-04-09 DIAGNOSIS — O98811 Other maternal infectious and parasitic diseases complicating pregnancy, first trimester: Secondary | ICD-10-CM | POA: Diagnosis not present

## 2017-04-09 LAB — URINALYSIS, ROUTINE W REFLEX MICROSCOPIC
Bilirubin Urine: NEGATIVE
Glucose, UA: NEGATIVE mg/dL
Hgb urine dipstick: NEGATIVE
Ketones, ur: NEGATIVE mg/dL
Nitrite: NEGATIVE
Protein, ur: NEGATIVE mg/dL
Specific Gravity, Urine: 1.015 (ref 1.005–1.030)
pH: 7 (ref 5.0–8.0)

## 2017-04-09 LAB — WET PREP, GENITAL
Clue Cells Wet Prep HPF POC: NONE SEEN
Sperm: NONE SEEN
Trich, Wet Prep: NONE SEEN
Yeast Wet Prep HPF POC: NONE SEEN

## 2017-04-09 LAB — POCT PREGNANCY, URINE: Preg Test, Ur: POSITIVE — AB

## 2017-04-09 NOTE — MAU Provider Note (Signed)
Chief Complaint: Vaginal Discharge   First Provider Initiated Contact with Patient 04/09/17 1110      SUBJECTIVE HPI: Tamara Lee is a 41 y.o. G3P1001 at [redacted]w[redacted]d by LMP who presents to maternity admissions reporting vaginal discharge. She reports discharge beginning today around 0530, describes discharge as clear thin with no odor. She reports taking Macrobid for UTI last week. She states that she is concerned that the discharge is amniotic fluid. She report PPROM @ 20 weeks with previous pregnancy that led to an IUFD. Patient receives [facility], was seen in the office two weeks ago for confirmation of pregnancy visit and next appt is late January. She denies vaginal bleeding, vaginal itching/burning, abdominal pain, urinary symptoms, h/a, dizziness, n/v, or fever/chills.    Past Medical History:  Diagnosis Date  . Cervical dysplasia    CIN 1  . History of abnormal cervical Pap smear   . IUFD at 16 weeks or more of gestation 12/15/2015   PPROM at 68 weeks followed by IUFD   Past Surgical History:  Procedure Laterality Date  . CESAREAN SECTION    . COLPOSCOPY  2011   Dr. Ferne Reus CIN 1 +HPV  . DILATION AND CURETTAGE OF UTERUS N/A 12/15/2015   Procedure: DILATATION AND CURETTAGE;  Surgeon: Molli Posey, MD;  Location: [facility];  Service: Gynecology;  Laterality: N/A; for retained placenta after 20 week loss   Social History   Socioeconomic History  . Marital status: Married    Spouse name: Not on file  . Number of children: 1  . Years of education: Not on file  . Highest education level: Not on file  Social Needs  . Financial resource strain: Not on file  . Food insecurity - worry: Not on file  . Food insecurity - inability: Not on file  . Transportation needs - medical: Not on file  . Transportation needs - non-medical: Not on file  Occupational History  . Occupation: works at [employer]  Tobacco Use  . Smoking status: Never Smoker  . Smokeless tobacco: Never Used   Substance and Sexual Activity  . Alcohol use: No    Alcohol/week: 0.0 oz  . Drug use: No  . Sexual activity: Yes    Birth control/protection: None  Other Topics Concern  . Not on file  Social History Narrative  . Not on file   No current facility-administered medications on file prior to encounter.    Current Outpatient Medications on File Prior to Encounter  Medication Sig Dispense Refill  . Multiple Vitamin (MULTIVITAMIN) tablet Take 1 tablet by mouth daily.     No Known Allergies  ROS:  Review of Systems  Constitutional: Negative.   Respiratory: Negative.   Cardiovascular: Negative.   Gastrointestinal: Negative.   Genitourinary: Positive for vaginal discharge. Negative for difficulty urinating, dysuria, frequency, pelvic pain, urgency, vaginal bleeding and vaginal pain.  Musculoskeletal: Negative.   Skin: Negative.   Neurological: Negative.   Psychiatric/Behavioral: Negative.    I have reviewed patient's Past Medical Hx, Surgical Hx, Family Hx, Social Hx, medications and allergies.   Physical Exam   Patient Vitals for the past 24 hrs:  BP Temp Temp src Pulse Resp Height Weight  04/09/17 1106 126/78 98.3 F (36.8 C) Oral 92 16 - -  04/09/17 1059 - - - - - 5\' 4"  (1.626 m) 142 lb (64.4 kg)   Constitutional: Well-developed, well-nourished female in no acute distress.  Cardiovascular: normal rate Respiratory: normal effort GI: Abd soft, non-tender. Pos  BS x 4 MS: Extremities nontender, no edema, normal ROM Neurologic: Alert and oriented x 4.  GU: Neg CVAT.  PELVIC EXAM: Cervix pink, visually closed, without lesion, moderate thick white cloudy discharge- adherent to cervix and vaginal walls with no odor, and external genitalia normal Bimanual exam: Cervix 0/long/high, firm, anterior, neg CMT, uterus nontender, nonenlarged, adnexa without tenderness, enlargement, or mass   LAB RESULTS Results for orders placed or performed during the hospital encounter of 04/09/17  (from the past 24 hour(s))  Urinalysis, Routine w reflex microscopic     Status: Abnormal   Collection Time: 04/09/17 10:55 AM  Result Value Ref Range   Color, Urine YELLOW YELLOW   APPearance CLEAR CLEAR   Specific Gravity, Urine 1.015 1.005 - 1.030   pH 7.0 5.0 - 8.0   Glucose, UA NEGATIVE NEGATIVE mg/dL   Hgb urine dipstick NEGATIVE NEGATIVE   Bilirubin Urine NEGATIVE NEGATIVE   Ketones, ur NEGATIVE NEGATIVE mg/dL   Protein, ur NEGATIVE NEGATIVE mg/dL   Nitrite NEGATIVE NEGATIVE   Leukocytes, UA SMALL (A) NEGATIVE   RBC / HPF 0-5 0 - 5 RBC/hpf   WBC, UA TOO NUMEROUS TO COUNT 0 - 5 WBC/hpf   Bacteria, UA RARE (A) NONE SEEN   Squamous Epithelial / LPF 0-5 (A) NONE SEEN   Mucus PRESENT   Pregnancy, urine POC     Status: Abnormal   Collection Time: 04/09/17 11:04 AM  Result Value Ref Range   Preg Test, Ur POSITIVE (A) NEGATIVE  Wet prep, genital     Status: Abnormal   Collection Time: 04/09/17 11:10 AM  Result Value Ref Range   Yeast Wet Prep HPF POC NONE SEEN NONE SEEN   Trich, Wet Prep NONE SEEN NONE SEEN   Clue Cells Wet Prep HPF POC NONE SEEN NONE SEEN   WBC, Wet Prep HPF POC MANY (A) NONE SEEN   Sperm NONE SEEN     MAU Management/MDM: Orders Placed This Encounter  Procedures  . Wet prep, genital  . Culture, OB Urine  . Urinalysis, Routine w reflex microscopic  . Pregnancy, urine POC  Wet prep- yeast negative on wet prep, clinical evaluation and assessment is yeast infection during pregnancy   Consulted Dr Simona Huh.   Offered terazole for treatment of yeast- patient declined treatment Pt discharged with instructions to follow up if discharge continues in 1 week.  ASSESSMENT 1. Candidal vulvovaginitis   2. Vaginal discharge during pregnancy in first trimester     PLAN Discharge home Follow up in one week with provider if discharge continues  Return to MAU as needed for emergencies    Allergies as of 04/09/2017   No Known Allergies     Medication List     TAKE these medications   multivitamin tablet Take 1 tablet by mouth daily.       Darrol Poke  Certified Nurse-Midwife 04/09/2017  11:24 AM

## 2017-04-09 NOTE — MAU Note (Signed)
Patient presents with ? Leaking fluid, having vaginal discharge since 0530 today.

## 2017-04-10 LAB — GC/CHLAMYDIA PROBE AMP (~~LOC~~) NOT AT ARMC
Chlamydia: NEGATIVE
Neisseria Gonorrhea: NEGATIVE

## 2017-04-11 LAB — CULTURE, OB URINE: Culture: >=100000 — AB

## 2017-04-12 ENCOUNTER — Other Ambulatory Visit: Payer: Self-pay | Admitting: Student

## 2017-04-30 DIAGNOSIS — Z98891 History of uterine scar from previous surgery: Secondary | ICD-10-CM | POA: Insufficient documentation

## 2017-04-30 DIAGNOSIS — D473 Essential (hemorrhagic) thrombocythemia: Secondary | ICD-10-CM | POA: Insufficient documentation

## 2017-04-30 DIAGNOSIS — O09529 Supervision of elderly multigravida, unspecified trimester: Secondary | ICD-10-CM | POA: Insufficient documentation

## 2017-04-30 DIAGNOSIS — D75839 Thrombocytosis, unspecified: Secondary | ICD-10-CM | POA: Insufficient documentation

## 2017-04-30 DIAGNOSIS — Z8751 Personal history of pre-term labor: Secondary | ICD-10-CM | POA: Insufficient documentation

## 2017-05-05 DIAGNOSIS — O3660X0 Maternal care for excessive fetal growth, unspecified trimester, not applicable or unspecified: Secondary | ICD-10-CM | POA: Insufficient documentation

## 2017-05-09 DIAGNOSIS — D259 Leiomyoma of uterus, unspecified: Secondary | ICD-10-CM | POA: Insufficient documentation

## 2017-05-10 ENCOUNTER — Other Ambulatory Visit (HOSPITAL_COMMUNITY): Payer: Self-pay | Admitting: Obstetrics and Gynecology

## 2017-05-10 DIAGNOSIS — Z8751 Personal history of pre-term labor: Secondary | ICD-10-CM

## 2017-05-10 DIAGNOSIS — Z3A13 13 weeks gestation of pregnancy: Secondary | ICD-10-CM

## 2017-05-10 DIAGNOSIS — Z3686 Encounter for antenatal screening for cervical length: Secondary | ICD-10-CM

## 2017-05-15 ENCOUNTER — Encounter (HOSPITAL_COMMUNITY): Payer: Self-pay

## 2017-05-17 ENCOUNTER — Ambulatory Visit (HOSPITAL_COMMUNITY)
Admission: RE | Admit: 2017-05-17 | Discharge: 2017-05-17 | Disposition: A | Discharge status: Home / Self Care | Payer: BLUE CROSS/BLUE SHIELD | Source: Ambulatory Visit | Attending: Obstetrics and Gynecology | Admitting: Obstetrics and Gynecology

## 2017-05-17 ENCOUNTER — Encounter (HOSPITAL_COMMUNITY): Payer: Self-pay

## 2017-05-17 DIAGNOSIS — D259 Leiomyoma of uterus, unspecified: Secondary | ICD-10-CM | POA: Diagnosis not present

## 2017-05-17 DIAGNOSIS — O09521 Supervision of elderly multigravida, first trimester: Secondary | ICD-10-CM | POA: Insufficient documentation

## 2017-05-17 DIAGNOSIS — Z87410 Personal history of cervical dysplasia: Secondary | ICD-10-CM | POA: Diagnosis not present

## 2017-05-17 DIAGNOSIS — O09291 Supervision of pregnancy with other poor reproductive or obstetric history, first trimester: Secondary | ICD-10-CM | POA: Insufficient documentation

## 2017-05-17 DIAGNOSIS — O3411 Maternal care for benign tumor of corpus uteri, first trimester: Secondary | ICD-10-CM | POA: Insufficient documentation

## 2017-05-17 DIAGNOSIS — Z8751 Personal history of pre-term labor: Secondary | ICD-10-CM

## 2017-05-17 DIAGNOSIS — O34219 Maternal care for unspecified type scar from previous cesarean delivery: Secondary | ICD-10-CM | POA: Diagnosis not present

## 2017-05-17 DIAGNOSIS — Z3686 Encounter for antenatal screening for cervical length: Secondary | ICD-10-CM

## 2017-05-17 DIAGNOSIS — Z3A13 13 weeks gestation of pregnancy: Secondary | ICD-10-CM | POA: Diagnosis not present

## 2017-05-17 DIAGNOSIS — O4401 Placenta previa specified as without hemorrhage, first trimester: Secondary | ICD-10-CM | POA: Insufficient documentation

## 2017-05-17 DIAGNOSIS — Z8249 Family history of ischemic heart disease and other diseases of the circulatory system: Secondary | ICD-10-CM | POA: Diagnosis not present

## 2017-05-17 HISTORY — DX: Unspecified abnormal cytological findings in specimens from vagina: R87.629

## 2017-05-17 HISTORY — DX: Urinary tract infection, site not specified: N39.0

## 2017-05-17 HISTORY — DX: Benign neoplasm of connective and other soft tissue, unspecified: D21.9

## 2017-05-17 NOTE — Consult Note (Signed)
42 year old, G3P1101, currently at 13 weeks 3 day gestation seen in consultation today with a history of a ~20 week IUFD  Her first pregnancy was relatively uncomplicated other than delivery of a 9# 2oz female infant by cesarean as she was told that her pelvis was too small  In her second pregnancy, she noted pelvic pressure that increased with standing and decreased with lying down at ~[redacted] weeks gestation. She then had ROM a week later and was noted to have an IUFD at ~[redacted] weeks gestation.   Past Medical History:  Diagnosis Date  . Cervical dysplasia    CIN 1  . Fibroid   . History of abnormal cervical Pap smear   . IUFD at 62 weeks or more of gestation 12/15/2015   PPROM at 26 weeks followed by IUFD  . UTI (urinary tract infection)   . Vaginal Pap smear, abnormal    Past Surgical History:  Procedure Laterality Date  . CESAREAN SECTION    . COLPOSCOPY  2011   Dr. Ferne Reus CIN 1 +HPV  . DILATION AND CURETTAGE OF UTERUS N/A 12/15/2015   Procedure: DILATATION AND CURETTAGE;  Surgeon: Molli Posey, MD;  Location: [facility];  Service: Gynecology;  Laterality: N/A; for retained placenta after 20 week loss  . LASIK    . WISDOM TOOTH EXTRACTION     Social History   Socioeconomic History  . Marital status: Married    Spouse name: Not on file  . Number of children: 1  . Years of education: Not on file  . Highest education level: Not on file  Social Needs  . Financial resource strain: Not on file  . Food insecurity - worry: Not on file  . Food insecurity - inability: Not on file  . Transportation needs - medical: Not on file  . Transportation needs - non-medical: Not on file  Occupational History  . Occupation: works at [employer]  Tobacco Use  . Smoking status: Never Smoker  . Smokeless tobacco: Never Used  Substance and Sexual Activity  . Alcohol use: No    Alcohol/week: 0.0 oz  . Drug use: No  . Sexual activity: Yes    Birth control/protection: None  Other Topics Concern  . Not  on file  Social History Narrative  . Not on file   Family History  Problem Relation Age of Onset  . Hypertension Mother   . Hypertension Father   . Hypertension Brother   . Diabetes Paternal Aunt   . Colon cancer Cousin        paternal   No Known Allergies  Current Outpatient Medications on File Prior to Encounter  Medication Sig Dispense Refill  . Multiple Vitamin (MULTIVITAMIN) tablet Take 1 tablet by mouth daily.    . Prenatal Vit-Fe Fumarate-FA (PRENATAL VITAMIN PO) Take by mouth.     No current facility-administered medications on file prior to encounter.    Impression #1 History compatible with incompetent cervix - Discussed management options to include expectant management vs ultrasound evaluation of the cervix serially until 24 weeks vs prophylactic cerclage. We would recommend prophylactic cervix at 14-[redacted] weeks gestation and she is in agreement with this plan.  #2 AMA - She had declined panorama testing with her provider earlier in pregnancy, but would like to consider having this performed now #3 History of cesarean - Counseling of TOL vs VBAC by her primary obstetric provider #4 Posterior placenta previa - Recheck later in pregnancy at anatomy scan and if still present,  at ~28 weeks #5 Known leiomyomata - Serial ultrasound for growth and evaluation monthly after fetal anatomic survey  Questions appear answered to her satisfaction. Precautions for the above given. Spent greater than 1/2 of 35 minute visit face to face counseling.

## 2017-05-20 ENCOUNTER — Encounter (HOSPITAL_COMMUNITY): Payer: Self-pay

## 2017-05-22 DIAGNOSIS — O4400 Placenta previa specified as without hemorrhage, unspecified trimester: Secondary | ICD-10-CM | POA: Insufficient documentation

## 2017-05-28 ENCOUNTER — Encounter (HOSPITAL_COMMUNITY): Payer: Self-pay | Admitting: *Deleted

## 2017-05-28 ENCOUNTER — Other Ambulatory Visit: Payer: Self-pay

## 2017-05-29 ENCOUNTER — Other Ambulatory Visit: Payer: Self-pay | Admitting: Obstetrics and Gynecology

## 2017-05-31 ENCOUNTER — Other Ambulatory Visit: Payer: Self-pay

## 2017-05-31 ENCOUNTER — Encounter (HOSPITAL_COMMUNITY)
Admission: RE | Disposition: A | Discharge status: Home / Self Care | Payer: Self-pay | Source: Ambulatory Visit | Attending: Obstetrics and Gynecology

## 2017-05-31 ENCOUNTER — Ambulatory Visit (HOSPITAL_COMMUNITY)
Admission: RE | Admit: 2017-05-31 | Discharge: 2017-05-31 | Disposition: A | Discharge status: Home / Self Care | Payer: BLUE CROSS/BLUE SHIELD | Source: Ambulatory Visit | Attending: Obstetrics and Gynecology | Admitting: Obstetrics and Gynecology

## 2017-05-31 ENCOUNTER — Encounter (HOSPITAL_COMMUNITY): Payer: Self-pay | Admitting: Anesthesiology

## 2017-05-31 ENCOUNTER — Ambulatory Visit (HOSPITAL_COMMUNITY): Payer: BLUE CROSS/BLUE SHIELD | Admitting: Anesthesiology

## 2017-05-31 DIAGNOSIS — O34219 Maternal care for unspecified type scar from previous cesarean delivery: Secondary | ICD-10-CM | POA: Diagnosis not present

## 2017-05-31 DIAGNOSIS — N858 Other specified noninflammatory disorders of uterus: Secondary | ICD-10-CM | POA: Insufficient documentation

## 2017-05-31 DIAGNOSIS — O3432 Maternal care for cervical incompetence, second trimester: Secondary | ICD-10-CM | POA: Insufficient documentation

## 2017-05-31 DIAGNOSIS — Z3A15 15 weeks gestation of pregnancy: Secondary | ICD-10-CM | POA: Diagnosis not present

## 2017-05-31 DIAGNOSIS — E785 Hyperlipidemia, unspecified: Secondary | ICD-10-CM | POA: Diagnosis not present

## 2017-05-31 DIAGNOSIS — O09522 Supervision of elderly multigravida, second trimester: Secondary | ICD-10-CM | POA: Insufficient documentation

## 2017-05-31 DIAGNOSIS — O99282 Endocrine, nutritional and metabolic diseases complicating pregnancy, second trimester: Secondary | ICD-10-CM | POA: Diagnosis not present

## 2017-05-31 DIAGNOSIS — O09212 Supervision of pregnancy with history of pre-term labor, second trimester: Secondary | ICD-10-CM | POA: Insufficient documentation

## 2017-05-31 HISTORY — DX: Anemia, unspecified: D64.9

## 2017-05-31 HISTORY — PX: CERVICAL CERCLAGE: SHX1329

## 2017-05-31 HISTORY — DX: Hyperlipidemia, unspecified: E78.5

## 2017-05-31 LAB — CBC
HEMATOCRIT: 32.3 % — AB (ref 36.0–46.0)
HEMOGLOBIN: 11.1 g/dL — AB (ref 12.0–15.0)
MCH: 30.2 pg (ref 26.0–34.0)
MCHC: 34.4 g/dL (ref 30.0–36.0)
MCV: 87.8 fL (ref 78.0–100.0)
Platelets: 273 10*3/uL (ref 150–400)
RBC: 3.68 MIL/uL — AB (ref 3.87–5.11)
RDW: 16.2 % — ABNORMAL HIGH (ref 11.5–15.5)
WBC: 10.2 10*3/uL (ref 4.0–10.5)

## 2017-05-31 SURGERY — CERCLAGE, CERVIX, VAGINAL APPROACH
Anesthesia: Spinal

## 2017-05-31 MED ORDER — HYDROCODONE-ACETAMINOPHEN 7.5-325 MG PO TABS: 1.0000 | ORAL_TABLET | Freq: Once | ORAL | Status: DC | PRN

## 2017-05-31 MED ORDER — METOCLOPRAMIDE HCL 5 MG/ML IJ SOLN: 10.0000 mg | Freq: Once | INTRAMUSCULAR | Status: DC | PRN

## 2017-05-31 MED ORDER — SODIUM CHLORIDE 0.9 % IJ SOLN
INTRAMUSCULAR | Status: DC | PRN
  Administered 2017-05-31: 30 mL via VAGINAL

## 2017-05-31 MED ORDER — SCOPOLAMINE 1 MG/3DAYS TD PT72: 1.0000 | MEDICATED_PATCH | Freq: Once | TRANSDERMAL | Status: DC

## 2017-05-31 MED ORDER — LACTATED RINGERS IV SOLN
INTRAVENOUS | Status: DC
  Administered 2017-05-31: 12:00:00 via INTRAVENOUS

## 2017-05-31 MED ORDER — BUPIVACAINE IN DEXTROSE 0.75-8.25 % IT SOLN
INTRATHECAL | Status: DC | PRN
  Administered 2017-05-31: 9 mg via INTRATHECAL

## 2017-05-31 MED ORDER — FENTANYL CITRATE (PF) 100 MCG/2ML IJ SOLN: 25.0000 ug | INTRAMUSCULAR | Status: DC | PRN

## 2017-05-31 MED ORDER — SODIUM CHLORIDE 0.9 % IJ SOLN
Freq: Once | INTRAMUSCULAR | Status: DC
  Filled 2017-05-31: qty 1

## 2017-05-31 SURGICAL SUPPLY — 18 items
CANISTER SUCT 3000ML PPV (MISCELLANEOUS) ×2 IMPLANT
GLOVE BIO SURGEON STRL SZ7.5 (GLOVE) ×2 IMPLANT
GLOVE BIOGEL PI IND STRL 7.0 (GLOVE) ×1 IMPLANT
GLOVE BIOGEL PI IND STRL 7.5 (GLOVE) ×1 IMPLANT
GLOVE BIOGEL PI INDICATOR 7.0 (GLOVE) ×1
GLOVE BIOGEL PI INDICATOR 7.5 (GLOVE) ×1
GOWN STRL REUS W/TWL LRG LVL3 (GOWN DISPOSABLE) ×4 IMPLANT
NS IRRIG 1000ML POUR BTL (IV SOLUTION) ×2 IMPLANT
PACK VAGINAL MINOR WOMEN LF (CUSTOM PROCEDURE TRAY) ×2 IMPLANT
PAD OB MATERNITY 4.3X12.25 (PERSONAL CARE ITEMS) ×2 IMPLANT
PAD PREP 24X48 CUFFED NSTRL (MISCELLANEOUS) ×2 IMPLANT
SUT MERSILENE 5MM BP 1 12 (SUTURE) ×2 IMPLANT
SUT PROLENE 1 CT 1 30 (SUTURE) ×2 IMPLANT
SYR BULB IRRIGATION 50ML (SYRINGE) ×2 IMPLANT
TOWEL OR 17X24 6PK STRL BLUE (TOWEL DISPOSABLE) ×2 IMPLANT
TRAY FOLEY CATH SILVER 14FR (SET/KITS/TRAYS/PACK) ×2 IMPLANT
TUBING NON-CON 1/4 X 20 CONN (TUBING) IMPLANT
YANKAUER SUCT BULB TIP NO VENT (SUCTIONS) IMPLANT

## 2017-05-31 NOTE — Anesthesia Postprocedure Evaluation (Signed)
Anesthesia Post Note  Patient: Tamara Lee  Procedure(s) Performed: CERCLAGE CERVICAL (N/A )     Patient location during evaluation: PACU Anesthesia Type: Spinal Level of consciousness: oriented and awake and alert Pain management: pain level controlled Vital Signs Assessment: post-procedure vital signs reviewed and stable Respiratory status: spontaneous breathing, respiratory function stable and nonlabored ventilation Cardiovascular status: blood pressure returned to baseline and stable Postop Assessment: no headache, no backache, no apparent nausea or vomiting, patient able to bend at knees and spinal receding Anesthetic complications: no    Last Vitals:  Vitals:   05/31/17 1415 05/31/17 1430  BP: 116/76 125/70  Pulse: 76 94  Resp: 13 15  Temp: 36.9 C   SpO2: 100% 100%    Last Pain:  Vitals:   05/31/17 1109  TempSrc: Oral   Pain Goal: Patients Stated Pain Goal: 4 (05/31/17 1430)               Jarrell Armond A.

## 2017-05-31 NOTE — Anesthesia Procedure Notes (Signed)
Spinal  Patient location during procedure: OR Start time: 05/31/2017 12:33 PM Staffing Anesthesiologist: Josephine Igo, MD Performed: anesthesiologist  Preanesthetic Checklist Completed: patient identified, site marked, surgical consent, pre-op evaluation, timeout performed, IV checked, risks and benefits discussed and monitors and equipment checked Spinal Block Patient position: sitting Prep: site prepped and draped and DuraPrep Patient monitoring: heart rate, cardiac monitor, continuous pulse ox and blood pressure Approach: midline Location: L3-4 Injection technique: single-shot Needle Needle type: Pencan  Needle gauge: 24 G Needle length: 9 cm Needle insertion depth: 5 cm Assessment Sensory level: T8 Additional Notes Patient tolerated procedure well. Adequate sensory level.

## 2017-05-31 NOTE — Op Note (Addendum)
Preop Diagnosis: 1/15 3/7wks 2.Personal History of Incompetent Cervix  Postop Diagnosis: 1.15 3/7wks 2.Personal History of Incompetent Cervix  Procedure: CERCLAGE CERVICAL   Anesthesia: Spinal   Anesthesiologist: Josephine Igo, MD   Attending: Everett Graff, MD   Assistant: N/a  Findings: Nl appearing cervix and closed  Pathology: N/a  Fluids: See flowsheet  UOP: See flowsheet  EBL: 2cc  Complications: None  Procedure:Then patient was taken to the operating room after the risks, benefits and alternatives discussed with the patient and consent signed and witnessed.  The patient was given a spinal per anesthesia and placed in the dorsal lithotomy position.  The patient was prepped and draped in the usual sterile fashion.  A cervical cerclage stitch was placed using Mersilene and the knot was tied anteriorly on the cervix with a stitch of 1 prolene at the base to help elevate knot if necessary when it comes time for removal.  Clindamycin douche was performed.  Membranes remained intact and post procedure fetal heart rate was 160s.  Sponge, lap and needle count was correct and the patient was transferred to the recovery room in good condition.

## 2017-05-31 NOTE — Anesthesia Preprocedure Evaluation (Addendum)
Anesthesia Evaluation  Patient identified by MRN, date of birth, ID band Patient awake    Reviewed: Allergy & Precautions, NPO status , Patient's Chart, lab work & pertinent test results  Airway Mallampati: II  TM Distance: >3 FB Neck ROM: Full    Dental  (+) Teeth Intact   Pulmonary neg pulmonary ROS,    Pulmonary exam normal breath sounds clear to auscultation       Cardiovascular negative cardio ROS Normal cardiovascular exam Rhythm:Regular Rate:Normal     Neuro/Psych negative neurological ROS  negative psych ROS   GI/Hepatic negative GI ROS, Neg liver ROS,   Endo/Other  Hyperlipidemia-diet controlled  Renal/GU negative Renal ROS     Musculoskeletal negative musculoskeletal ROS (+)   Abdominal   Peds  Hematology  (+) anemia ,   Anesthesia Other Findings   Reproductive/Obstetrics (+) Pregnancy AMA Hx/o Preterm labor                             Anesthesia Physical Anesthesia Plan  ASA: II  Anesthesia Plan: Spinal   Post-op Pain Management:    Induction:   PONV Risk Score and Plan: 2 and Treatment may vary due to age or medical condition, Ondansetron and Dexamethasone  Airway Management Planned: Natural Airway  Additional Equipment:   Intra-op Plan:   Post-operative Plan:   Informed Consent: I have reviewed the patients History and Physical, chart, labs and discussed the procedure including the risks, benefits and alternatives for the proposed anesthesia with the patient or authorized representative who has indicated his/her understanding and acceptance.   Dental advisory given  Plan Discussed with: CRNA, Anesthesiologist and Surgeon  Anesthesia Plan Comments:         Anesthesia Quick Evaluation

## 2017-05-31 NOTE — H&P (Addendum)
Tamara Lee is an 42 y.o. female. Pt presents for cervical cerclage due to h/o incompetent cervix.  Denies any LOF, VB or cramping.   Menstrual History: Patient's last menstrual period was 02/12/2017.    Past Medical History:  Diagnosis Date  . Anemia   . Cervical dysplasia    CIN 1  . Fibroid   . History of abnormal cervical Pap smear   . Hyperlipidemia    diet controlled,no meds  . IUFD at 60 weeks or more of gestation 12/15/2015   PPROM at 70 weeks followed by IUFD  . UTI (urinary tract infection)   . Vaginal Pap smear, abnormal     Past Surgical History:  Procedure Laterality Date  . CESAREAN SECTION  2007   x 1  . COLPOSCOPY  2011   Dr. Ferne Reus CIN 1 +HPV  . DILATION AND CURETTAGE OF UTERUS N/A 12/15/2015   Procedure: DILATATION AND CURETTAGE;  Surgeon: Molli Posey, MD;  Location: [facility];  Service: Gynecology;  Laterality: N/A; for retained placenta after 20 week loss  . LASIK Bilateral   . WISDOM TOOTH EXTRACTION      Family History  Problem Relation Age of Onset  . Hypertension Mother   . Hypertension Father   . Hypertension Brother   . Diabetes Paternal Aunt   . Colon cancer Cousin        paternal    Social History:  reports that  has never smoked. she has never used smokeless tobacco. She reports that she does not drink alcohol or use drugs.  Allergies: No Known Allergies  Medications Prior to Admission  Medication Sig Dispense Refill Last Dose  . IRON PO Take 1 tablet by mouth daily.   05/30/2017 at Unknown time  . Prenatal Vit-Fe Fumarate-FA (PRENATAL VITAMIN PO) Take 1 tablet by mouth daily.    05/30/2017 at Unknown time    ROS Non-contributory  Blood pressure 99/72, pulse 84, temperature 97.9 F (36.6 C), temperature source Oral, resp. rate 16, height 5\' 4"  (1.626 m), weight 154 lb (69.9 kg), last menstrual period 02/12/2017, SpO2 100 %. Physical Exam Lungs CTA CV RRR Abd gravid Ext no calf tenderness  Results for orders placed  or performed during the hospital encounter of 05/31/17 (from the past 24 hour(s))  CBC     Status: Abnormal   Collection Time: 05/31/17 11:05 AM  Result Value Ref Range   WBC 10.2 4.0 - 10.5 K/uL   RBC 3.68 (L) 3.87 - 5.11 MIL/uL   Hemoglobin 11.1 (L) 12.0 - 15.0 g/dL   HCT 32.3 (L) 36.0 - 46.0 %   MCV 87.8 78.0 - 100.0 fL   MCH 30.2 26.0 - 34.0 pg   MCHC 34.4 30.0 - 36.0 g/dL   RDW 16.2 (H) 11.5 - 15.5 %   Platelets 273 150 - 400 K/uL    FHR 167  Assessment/Plan: P1 at 15 3/7wks with h/o incompetent cervix presenting for scheduled cervical cerclage.  R/B/A discussed with pt including but not limited to B/I/I and loss of pregnancy.  Consent s/w and pt had no further questions.  Delice Lesch 05/31/2017, 12:10 PM

## 2017-05-31 NOTE — Transfer of Care (Signed)
Immediate Anesthesia Transfer of Care Note  Patient: Tamara Lee  Procedure(s) Performed: CERCLAGE CERVICAL (N/A )  Patient Location: PACU  Anesthesia Type:Spinal  Level of Consciousness: awake, alert  and oriented  Airway & Oxygen Therapy: Patient Spontanous Breathing  Post-op Assessment: Report given to RN and Post -op Vital signs reviewed and stable  Post vital signs: Reviewed and stable  Last Vitals:  Vitals:   05/31/17 1109  BP: 99/72  Pulse: 84  Resp: 16  Temp: 36.6 C  SpO2: 100%    Last Pain:  Vitals:   05/31/17 1109  TempSrc: Oral      Patients Stated Pain Goal: 4 (34/74/25 9563)  Complications: No apparent anesthesia complications

## 2017-06-01 ENCOUNTER — Encounter (HOSPITAL_COMMUNITY): Payer: Self-pay | Admitting: Obstetrics and Gynecology

## 2017-08-05 ENCOUNTER — Inpatient Hospital Stay (HOSPITAL_COMMUNITY)
Admission: AD | Admit: 2017-08-05 | Discharge: 2017-08-06 | Disposition: A | Discharge status: Home / Self Care | Payer: BLUE CROSS/BLUE SHIELD | Source: Ambulatory Visit | Attending: Obstetrics and Gynecology | Admitting: Obstetrics and Gynecology

## 2017-08-05 ENCOUNTER — Encounter (HOSPITAL_COMMUNITY): Payer: Self-pay

## 2017-08-05 ENCOUNTER — Inpatient Hospital Stay (HOSPITAL_COMMUNITY): Payer: BLUE CROSS/BLUE SHIELD

## 2017-08-05 DIAGNOSIS — O09522 Supervision of elderly multigravida, second trimester: Secondary | ICD-10-CM | POA: Diagnosis present

## 2017-08-05 DIAGNOSIS — R000 Tachycardia, unspecified: Secondary | ICD-10-CM | POA: Insufficient documentation

## 2017-08-05 DIAGNOSIS — R0602 Shortness of breath: Secondary | ICD-10-CM | POA: Insufficient documentation

## 2017-08-05 DIAGNOSIS — O26892 Other specified pregnancy related conditions, second trimester: Secondary | ICD-10-CM | POA: Diagnosis not present

## 2017-08-05 DIAGNOSIS — Z3A24 24 weeks gestation of pregnancy: Secondary | ICD-10-CM | POA: Insufficient documentation

## 2017-08-05 DIAGNOSIS — Z8249 Family history of ischemic heart disease and other diseases of the circulatory system: Secondary | ICD-10-CM | POA: Insufficient documentation

## 2017-08-05 DIAGNOSIS — O26893 Other specified pregnancy related conditions, third trimester: Secondary | ICD-10-CM

## 2017-08-05 DIAGNOSIS — O99512 Diseases of the respiratory system complicating pregnancy, second trimester: Secondary | ICD-10-CM | POA: Diagnosis not present

## 2017-08-05 LAB — COMPREHENSIVE METABOLIC PANEL
ALK PHOS: 71 U/L (ref 38–126)
ALT: 14 U/L (ref 14–54)
AST: 15 U/L (ref 15–41)
Albumin: 2.3 g/dL — ABNORMAL LOW (ref 3.5–5.0)
Anion gap: 10 (ref 5–15)
BUN: 10 mg/dL (ref 6–20)
CALCIUM: 8.6 mg/dL — AB (ref 8.9–10.3)
CO2: 20 mmol/L — AB (ref 22–32)
CREATININE: 0.48 mg/dL (ref 0.44–1.00)
Chloride: 105 mmol/L (ref 101–111)
Glucose, Bld: 88 mg/dL (ref 65–99)
Potassium: 4.1 mmol/L (ref 3.5–5.1)
Sodium: 135 mmol/L (ref 135–145)
Total Bilirubin: 0.3 mg/dL (ref 0.3–1.2)
Total Protein: 7 g/dL (ref 6.5–8.1)

## 2017-08-05 LAB — CBC
HEMATOCRIT: 28.5 % — AB (ref 36.0–46.0)
HEMOGLOBIN: 9.7 g/dL — AB (ref 12.0–15.0)
MCH: 31 pg (ref 26.0–34.0)
MCHC: 34 g/dL (ref 30.0–36.0)
MCV: 91.1 fL (ref 78.0–100.0)
Platelets: 300 10*3/uL (ref 150–400)
RBC: 3.13 MIL/uL — AB (ref 3.87–5.11)
RDW: 13.6 % (ref 11.5–15.5)
WBC: 13.9 10*3/uL — ABNORMAL HIGH (ref 4.0–10.5)

## 2017-08-05 MED ORDER — IOPAMIDOL (ISOVUE-370) INJECTION 76%
100.0000 mL | Freq: Once | INTRAVENOUS | Status: AC | PRN
  Administered 2017-08-05: 100 mL via INTRAVENOUS

## 2017-08-05 NOTE — MAU Note (Signed)
Urine sent to lab 

## 2017-08-05 NOTE — MAU Provider Note (Addendum)
Chief Complaint:  Shortness of Breath   First Provider Initiated Contact with Patient 08/05/17 1848      HPI: Tamara Lee is a 42 y.o. G3P1001 at [redacted]w[redacted]d who presents to maternity admissions reporting shortness of breath with exertion x 1 week, worsening 3 days ago. The shortness of breath is so severe she had to leave work early 2 days ago.  It resolves with rest.  She denies any associated symptoms. Pertinent negatives include no chest pain, fever/chills and no respiratory or cardiac history.  She has not tried any treatments other than rest.   She reports good fetal movement, denies abdominal pain, LOF, vaginal bleeding, vaginal itching/burning, urinary symptoms, h/a, dizziness, n/v, or fever/chills.    HPI  Past Medical History: Past Medical History:  Diagnosis Date  . Anemia   . Cervical dysplasia    CIN 1  . Fibroid   . History of abnormal cervical Pap smear   . Hyperlipidemia    diet controlled,no meds  . IUFD at 35 weeks or more of gestation 12/15/2015   PPROM at 75 weeks followed by IUFD  . UTI (urinary tract infection)   . Vaginal Pap smear, abnormal     Past obstetric history: OB History  Gravida Para Term Preterm AB Living  3 2 1  0 0 1  SAB TAB Ectopic Multiple Live Births        0 1    # Outcome Date GA Lbr Len/2nd Weight Sex Delivery Anes PTL Lv  3 Current           2 Para 12/15/15 [redacted]w[redacted]d / 00:17 9.2 oz (0.26 kg) F Vag-Spont None  FD  1 Term 2007   9 lb 3.2 oz (4.173 kg) M CS-LTranv   LIV     Complications: Cephalopelvic Disproportion    Past Surgical History: Past Surgical History:  Procedure Laterality Date  . CERVICAL CERCLAGE N/A 05/31/2017   Procedure: CERCLAGE CERVICAL;  Surgeon: Everett Graff, MD;  Location: [facility];  Service: Gynecology;  Laterality: N/A;  . CESAREAN SECTION  2007   x 1  . COLPOSCOPY  2011   Dr. Ferne Reus CIN 1 +HPV  . DILATION AND CURETTAGE OF UTERUS N/A 12/15/2015   Procedure: DILATATION AND CURETTAGE;  Surgeon: Molli Posey, MD;  Location: [facility];  Service: Gynecology;  Laterality: N/A; for retained placenta after 20 week loss  . LASIK Bilateral   . LASIK Bilateral 04/16/2010  . WISDOM TOOTH EXTRACTION      Family History: Family History  Problem Relation Age of Onset  . Hypertension Mother   . Hypertension Father   . Hypertension Brother   . Diabetes Paternal Aunt   . Colon cancer Cousin        paternal    Social History: Social History   Tobacco Use  . Smoking status: Never Smoker  . Smokeless tobacco: Never Used  Substance Use Topics  . Alcohol use: No    Alcohol/week: 0.0 oz  . Drug use: No    Allergies: No Known Allergies  Meds:  Medications Prior to Admission  Medication Sig Dispense Refill Last Dose  . IRON PO Take 1 tablet by mouth daily.   05/30/2017 at Unknown time  . Prenatal Vit-Fe Fumarate-FA (PRENATAL VITAMIN PO) Take 1 tablet by mouth daily.    05/30/2017 at Unknown time    ROS:  Review of Systems  Constitutional: Negative for chills, fatigue and fever.  HENT: Negative for congestion and rhinorrhea.   Eyes:  Negative for visual disturbance.  Respiratory: Positive for shortness of breath. Negative for cough and chest tightness.   Cardiovascular: Negative for chest pain.  Gastrointestinal: Negative for abdominal pain, nausea and vomiting.  Genitourinary: Negative for difficulty urinating, dysuria, flank pain, pelvic pain, vaginal bleeding, vaginal discharge and vaginal pain.  Neurological: Negative for dizziness and headaches.  Psychiatric/Behavioral: Negative.      I have reviewed patient's Past Medical Hx, Surgical Hx, Family Hx, Social Hx, medications and allergies.   Physical Exam   Patient Vitals for the past 24 hrs:  BP Temp Temp src Pulse Resp SpO2 Height Weight  08/05/17 1900 - - - - - 99 % - -  08/05/17 1838 - - - - - 100 % - -  08/05/17 1825 - - - - - 99 % - -  08/05/17 1730 115/67 99.1 F (37.3 C) Oral (!) 111 19 99 % 5\' 4"  (1.626 m) 168 lb (76.2  kg)   Constitutional: Well-developed, well-nourished female in no acute distress.  HEART: mild tachycardia, heart sounds, regular rhythm RESP: normal effort, lung sounds clear and equal bilaterally GI: Abd soft, non-tender, gravid appropriate for gestational age.  MS: Extremities nontender, no edema, normal ROM Neurologic: Alert and oriented x 4.  GU: Neg CVAT.     FHT:  Baseline 155, moderate variability, accelerations present, no decelerations Contractions: None on toco or to palpation   Labs: Results for orders placed or performed during the hospital encounter of 08/05/17 (from the past 24 hour(s))  CBC     Status: Abnormal   Collection Time: 08/05/17  7:14 PM  Result Value Ref Range   WBC 13.9 (H) 4.0 - 10.5 K/uL   RBC 3.13 (L) 3.87 - 5.11 MIL/uL   Hemoglobin 9.7 (L) 12.0 - 15.0 g/dL   HCT 28.5 (L) 36.0 - 46.0 %   MCV 91.1 78.0 - 100.0 fL   MCH 31.0 26.0 - 34.0 pg   MCHC 34.0 30.0 - 36.0 g/dL   RDW 13.6 11.5 - 15.5 %   Platelets 300 150 - 400 K/uL  Comprehensive metabolic panel     Status: Abnormal   Collection Time: 08/05/17  7:14 PM  Result Value Ref Range   Sodium 135 135 - 145 mmol/L   Potassium 4.1 3.5 - 5.1 mmol/L   Chloride 105 101 - 111 mmol/L   CO2 20 (L) 22 - 32 mmol/L   Glucose, Bld 88 65 - 99 mg/dL   BUN 10 6 - 20 mg/dL   Creatinine, Ser 0.48 0.44 - 1.00 mg/dL   Calcium 8.6 (L) 8.9 - 10.3 mg/dL   Total Protein 7.0 6.5 - 8.1 g/dL   Albumin 2.3 (L) 3.5 - 5.0 g/dL   AST 15 15 - 41 U/L   ALT 14 14 - 54 U/L   Alkaline Phosphatase 71 38 - 126 U/L   Total Bilirubin 0.3 0.3 - 1.2 mg/dL   GFR calc non Af Amer >60 >60 mL/min   GFR calc Af Amer >60 >60 mL/min   Anion gap 10 5 - 15      Imaging:  No results found.  MAU Course/MDM: CBC, CMP, EKG ordered and results wnl except mild tachycardia with HR 106 and anemia with Hgb 9.7 NST reviewed and reactive Dr Charlesetta Garibaldi to bedside to evaluate pt With sudden worsening of SOB and mild tachycardia, consider PE CT  scan to rule out PE ordered Report to Maye Hides, CNM with CT scan results pending   Tamara Lee  Certified Nurse-Midwife 08/05/2017 10:09 PM   Ct results show no evidence of PE, however, lower lobes unable to be evaluated due to presence of aelectasis vs. Airspace disease.   Discussed CT results, patient's physical presentation with Dr. Charlesetta Garibaldi, who recommends that patient be discharged with incentive spirometer, and resume iron, as well as colace and miralax.   NST reviewed: 160 bpm, mod var, present acels, occasional variables, no contractions.    1. Shortness of breath due to pregnancy in third trimester    2. Patient stable for discharge with recommendations to take iron twice a day, take colace and miralax daily, and use incentive spirometer every two hours.   3. Warning signs reviewed: bleeding, leaking of fluid, shortness of breath that does not resolve with rest, water, food.  4. Patient verbalized understanding.

## 2017-08-05 NOTE — MAU Note (Signed)
Patients states she was seen in MD office today for appt. Because of c/o SOB for about a week now. States SOB only when she ambulates, but not at rest.

## 2017-08-05 NOTE — MAU Note (Signed)
Graphing maternal HR; ultrasound adjusted.

## 2017-08-05 NOTE — Progress Notes (Signed)
Pt feels better because she is laying down. BP 115/67 (BP Location: Right Arm)   Pulse (!) 111   Temp 99.1 F (37.3 C) (Oral)   Resp 19   Ht 5\' 4"  (1.626 m)   Wt 76.2 kg (168 lb)   LMP 02/12/2017   SpO2 99%   BMI 28.84 kg/m   Lungs CTAB Abd ND , soft NT Will do CT scan to R/O PE

## 2017-08-05 NOTE — MAU Note (Signed)
Pt was seen in office today for shortness of breath x one week, worsening. Denies fever. Occasional cough, nonproductive

## 2017-08-05 NOTE — MAU Note (Signed)
Called Dr. Charlesetta Garibaldi to report patient's arrival on unit and complaint. Dr. Charlesetta Garibaldi asked that MAU provider see patient and call her after seeing patient.

## 2017-08-06 DIAGNOSIS — O26892 Other specified pregnancy related conditions, second trimester: Secondary | ICD-10-CM | POA: Diagnosis not present

## 2017-08-06 DIAGNOSIS — Z3A24 24 weeks gestation of pregnancy: Secondary | ICD-10-CM | POA: Diagnosis not present

## 2017-08-06 DIAGNOSIS — R0602 Shortness of breath: Secondary | ICD-10-CM

## 2017-08-06 NOTE — Discharge Instructions (Signed)
-Take iron once a day, take colace three times a day and Miralax twice a day.  -Use incentive spirometer (see directions below).   Incentive Spirometer An incentive spirometer is a tool that measures how well you are filling your lungs with each breath. This tool can help keep your lungs clear and active. Taking long, deep breaths may help reverse or decrease the chance of developing breathing (pulmonary) problems, especially infection, following:  Surgery of the chest or abdomen.  Surgery if you have a history of smoking or a lung problem.  A long period of time when you are unable to move or be active.  If the spirometer includes an indicator to show your best effort, your health care provider or respiratory therapist will help you set a goal. Keep a log of your progress if directed by your health care provider. What are the risks?  Breathing too quickly may cause dizziness or cause you to pass out. Take your time so you do not get dizzy or lightheaded.  If you are in pain, you may need to take or ask for pain medicine before doing incentive spirometry. It is harder to take a deep breath if you are having pain. How to use your incentive spirometer 1. Sit on the edge of your bed if possible, or sit up as far as you can in bed or on a chair. 2. Hold the incentive spirometer in an upright position. 3. Breathe out normally. 4. Place the mouthpiece in your mouth and seal your lips tightly around it. 5. Breathe in slowly and as deeply as possible, raising the piston or the ball toward the top of the column. 6. Hold your breath for 3-5 seconds or for as long as possible. Allow the piston or ball to fall to the bottom of the column. 7. Remove the mouthpiece from your mouth and breathe out normally. 8. The spirometer may include an indicator to show your best effort. Use the indicator as a goal to work toward during each repetition. 9. Rest for a few seconds and repeat this at least 10 times,  every 1-2 hours when you are awake. Take your time and take a few normal breaths between deep breaths. Breathing too quickly may cause dizziness or cause you to pass out. Take your time so you do not get dizzy or lightheaded. 10. After each set of 10 deep breaths, practice coughing to be sure your lungs are clear. If you had a surgical cut (incision) made during surgery, support your incision when coughing by placing a pillow or rolled-up towel firmly against it. Once you are able to get out of bed, walk around indoors and cough well. You may stop using the incentive spirometer when instructed by your health care provider. Contact a health care provider if:  You are having difficulty using the spirometer.  You have trouble using the spirometer as often as instructed.  Your pain medicine is not giving enough relief while using the spirometer.  You have a fever.  You develop shortness of breath. Get help right away if:  You develop a cough with bloody sputum.  You develop worsening pain, redness, or discharge at or near the incision site. This information is not intended to replace advice given to you by your health care provider. Make sure you discuss any questions you have with your health care provider. Document Released: 08/13/2006 Document Revised: 12/26/2015 Document Reviewed: 11/09/2013 Elsevier Interactive Patient Education  [employer].    Constipation,  Adult Constipation is when a person:  Poops (has a bowel movement) fewer times in a week than normal.  Has a hard time pooping.  Has poop that is dry, hard, or bigger than normal.  Follow these instructions at home: Eating and drinking   Eat foods that have a lot of fiber, such as: ? Fresh fruits and vegetables. ? Whole grains. ? Beans.  Eat less of foods that are high in fat, low in fiber, or overly processed, such as: ? [place] fries. ? Hamburgers. ? Cookies. ? Candy. ? Soda.  Drink enough fluid to keep  your pee (urine) clear or pale yellow. General instructions  Exercise regularly or as told by your doctor.  Go to the restroom when you feel like you need to poop. Do not hold it in.  Take over-the-counter and prescription medicines only as told by your doctor. These include any fiber supplements.  Do pelvic floor retraining exercises, such as: ? Doing deep breathing while relaxing your lower belly (abdomen). ? Relaxing your pelvic floor while pooping.  Watch your condition for any changes.  Keep all follow-up visits as told by your doctor. This is important. Contact a doctor if:  You have pain that gets worse.  You have a fever.  You have not pooped for 4 days.  You throw up (vomit).  You are not hungry.  You lose weight.  You are bleeding from the anus.  You have thin, pencil-like poop (stool). Get help right away if:  You have a fever, and your symptoms suddenly get worse.  You leak poop or have blood in your poop.  Your belly feels hard or bigger than normal (is bloated).  You have very bad belly pain.  You feel dizzy or you faint. This information is not intended to replace advice given to you by your health care provider. Make sure you discuss any questions you have with your health care provider. Document Released: 09/19/2007 Document Revised: 10/21/2015 Document Reviewed: 09/21/2015 Elsevier Interactive Patient Education  2018 [employer].

## 2017-09-01 DIAGNOSIS — O3430 Maternal care for cervical incompetence, unspecified trimester: Secondary | ICD-10-CM | POA: Insufficient documentation

## 2017-10-21 LAB — OB RESULTS CONSOLE GBS: STREP GROUP B AG: POSITIVE

## 2017-10-24 ENCOUNTER — Other Ambulatory Visit: Payer: Self-pay | Admitting: Obstetrics & Gynecology

## 2017-10-28 ENCOUNTER — Encounter (HOSPITAL_COMMUNITY): Payer: Self-pay

## 2017-10-29 ENCOUNTER — Telehealth (HOSPITAL_COMMUNITY): Payer: Self-pay | Admitting: *Deleted

## 2017-10-29 NOTE — Telephone Encounter (Signed)
Preadmission screen  

## 2017-10-30 ENCOUNTER — Encounter (HOSPITAL_COMMUNITY): Payer: Self-pay

## 2017-11-11 ENCOUNTER — Encounter (HOSPITAL_COMMUNITY)
Admission: RE | Admit: 2017-11-11 | Discharge: 2017-11-11 | Disposition: A | Discharge status: Home / Self Care | Payer: BLUE CROSS/BLUE SHIELD | Source: Ambulatory Visit | Attending: Obstetrics & Gynecology | Admitting: Obstetrics & Gynecology

## 2017-11-11 DIAGNOSIS — Z01818 Encounter for other preprocedural examination: Secondary | ICD-10-CM | POA: Diagnosis present

## 2017-11-11 DIAGNOSIS — D649 Anemia, unspecified: Secondary | ICD-10-CM | POA: Insufficient documentation

## 2017-11-11 HISTORY — DX: Essential (hemorrhagic) thrombocythemia: D47.3

## 2017-11-11 HISTORY — DX: Complete placenta previa nos or without hemorrhage, unspecified trimester: O44.00

## 2017-11-11 HISTORY — DX: Thrombocytosis, unspecified: D75.839

## 2017-11-11 LAB — TYPE AND SCREEN
ABO/RH(D): A POS
Antibody Screen: NEGATIVE

## 2017-11-11 LAB — CBC
HCT: 33.8 % — ABNORMAL LOW (ref 36.0–46.0)
Hemoglobin: 11.4 g/dL — ABNORMAL LOW (ref 12.0–15.0)
MCH: 32.1 pg (ref 26.0–34.0)
MCHC: 33.7 g/dL (ref 30.0–36.0)
MCV: 95.2 fL (ref 78.0–100.0)
PLATELETS: 155 10*3/uL (ref 150–400)
RBC: 3.55 MIL/uL — AB (ref 3.87–5.11)
RDW: 14.2 % (ref 11.5–15.5)
WBC: 7.3 10*3/uL (ref 4.0–10.5)

## 2017-11-11 NOTE — H&P (Addendum)
Tamara Lee is a 43 y.o. female presenting for elective repeat cesarean section and cerclage removal.  OB History    Gravida  3   Para  2   Term  1   Preterm  0   AB  0   Living  1     SAB      TAB      Ectopic      Multiple  0   Live Births  1          Past Medical History:  Diagnosis Date  . Anemia   . Cervical dysplasia    CIN 1  . Fibroid   . History of abnormal cervical Pap smear   . Hyperlipidemia    diet controlled,no meds  . IUFD at 34 weeks or more of gestation 12/15/2015   PPROM at 14 weeks followed by IUFD  . Placenta previa   . Thrombocytosis (Tamara Lee)   . UTI (urinary tract infection)   . Vaginal Pap smear, abnormal    Past Surgical History:  Procedure Laterality Date  . CERVICAL CERCLAGE N/A 05/31/2017   Procedure: CERCLAGE CERVICAL;  Surgeon: Tamara Graff, MD;  Location: [facility];  Service: Gynecology;  Laterality: N/A;  . CESAREAN SECTION  2007   x 1  . COLPOSCOPY  2011   Dr. Ferne Lee CIN 1 +HPV  . DILATION AND CURETTAGE OF UTERUS N/A 12/15/2015   Procedure: DILATATION AND CURETTAGE;  Surgeon: Tamara Posey, MD;  Location: [facility];  Service: Gynecology;  Laterality: N/A; for retained placenta after 20 week loss  . EYE SURGERY    . LASIK Bilateral   . LASIK Bilateral 04/16/2010  . WISDOM TOOTH EXTRACTION     Family History: family history includes Colon cancer in her cousin; Diabetes in her paternal aunt; Hypertension in her brother, father, and mother. Social History:  reports that she has never smoked. She has never used smokeless tobacco. She reports that she does not drink alcohol or use drugs.     Maternal Diabetes: No Genetic Screening: Normal Maternal Ultrasounds/Referrals: Normal Fetal Ultrasounds or other Referrals:  None Maternal Substance Abuse:  No Significant Maternal Medications:  None Significant Maternal Lab Results:  Lab values include: Group B Strep positive Other Comments:  History of cervical  insufficiency, cerclage in place  Review of Systems  denies F/C/N/V/D  Maternal Medical History:  Prenatal Complications - Diabetes: none.      Last menstrual period 02/12/2017. Maternal Exam:  Abdomen: Surgical scars: low transverse.   Fundal height is Size= dates.   Estimated fetal weight is 9lbs.   Fetal presentation: vertex     Physical Exam  Lungs CTA  CV RRR Abd gravid, NT Ext no calf tenderness  Prenatal labs: ABO, Rh: --/--/A POS (07/29 0945) Antibody: NEG (07/29 0945) Rubella: Immune (12/18 0000) RPR: Nonreactive (12/18 0000)  HBsAg: Negative (12/18 0000)  HIV: Non-reactive (12/18 0000)  GBS: Positive (07/08 0000)   Assessment/Plan: 42 y.o. G3P1 at 39 weeks Cerclage present GBS Positive Elective repeat cesarean section  H&P incomplete, pending physical assessment by Tamara Lee, CNM, NP-C on Wednesday morning.   Tamara Lee 11/11/2017, 2:29 PM  I saw pt morning of procedure and completed PE.  R/B/A reviewed and consent signed and witnessed.  Pt did not have any complaints.  Tamara Lee

## 2017-11-11 NOTE — Patient Instructions (Signed)
Tamara Lee  11/11/2017   Your procedure is scheduled on:  11/12/2017  Enter through the Main Entrance of [facility] up the phone at the desk and dial [phone]  Call this number if you have problems the morning of surgery:[phone]  Remember:   Do not eat food:(After Midnight) Desps de medianoche.  Do not drink clear liquids: (After Midnight) Desps de medianoche.  Take these medicines the morning of surgery with A SIP OF WATER: none   Do not wear jewelry, make-up or nail polish.  Do not wear lotions, powders, or perfumes. Do not wear deodorant.  Do not shave 48 hours prior to surgery.  Do not bring valuables to the hospital.  [facility] is not   responsible for any belongings or valuables brought to the hospital.  Contacts, dentures or bridgework may not be worn into surgery.  Leave suitcase in the car. After surgery it may be brought to your room.  For patients admitted to the hospital, checkout time is 11:00 AM the day of              discharge.    N/A   Please read over the following fact sheets that you were given:   Surgical Site Infection Prevention

## 2017-11-12 ENCOUNTER — Other Ambulatory Visit: Payer: Self-pay | Admitting: Obstetrics and Gynecology

## 2017-11-12 LAB — RPR: RPR Ser Ql: NONREACTIVE

## 2017-11-12 NOTE — Anesthesia Preprocedure Evaluation (Addendum)
Anesthesia Evaluation  Patient identified by MRN, date of birth, ID band Patient awake    Reviewed: Allergy & Precautions, NPO status , Patient's Chart, lab work & pertinent test results  History of Anesthesia Complications Negative for: history of anesthetic complications  Airway Mallampati: I  TM Distance: >3 FB Neck ROM: Full    Dental  (+) Teeth Intact   Pulmonary neg pulmonary ROS,    breath sounds clear to auscultation       Cardiovascular negative cardio ROS   Rhythm:Regular Rate:Normal     Neuro/Psych negative neurological ROS  negative psych ROS   GI/Hepatic negative GI ROS, Neg liver ROS,   Endo/Other  negative endocrine ROS  Renal/GU negative Renal ROS  negative genitourinary   Musculoskeletal negative musculoskeletal ROS (+)   Abdominal   Peds  Hematology  (+) anemia ,   Anesthesia Other Findings   Reproductive/Obstetrics (+) Pregnancy  Placenta previa Fibroids                             Anesthesia Physical Anesthesia Plan  ASA: II  Anesthesia Plan: Spinal   Post-op Pain Management:    Induction:   PONV Risk Score and Plan: 2 and Treatment may vary due to age or medical condition, Ondansetron and Scopolamine patch - Pre-op  Airway Management Planned: Natural Airway  Additional Equipment: None  Intra-op Plan:   Post-operative Plan:   Informed Consent: I have reviewed the patients History and Physical, chart, labs and discussed the procedure including the risks, benefits and alternatives for the proposed anesthesia with the patient or authorized representative who has indicated his/her understanding and acceptance.     Plan Discussed with: CRNA and Anesthesiologist  Anesthesia Plan Comments: (Labs reviewed, platelets acceptable. Discussed risks and benefits of spinal, including spinal/epidural hematoma, infection, failed block, and PDPH. Patient expressed  understanding and wished to proceed. )       Anesthesia Quick Evaluation

## 2017-11-13 ENCOUNTER — Inpatient Hospital Stay (HOSPITAL_COMMUNITY): Payer: BLUE CROSS/BLUE SHIELD | Admitting: Anesthesiology

## 2017-11-13 ENCOUNTER — Other Ambulatory Visit: Payer: Self-pay

## 2017-11-13 ENCOUNTER — Encounter (HOSPITAL_COMMUNITY): Payer: Self-pay | Admitting: *Deleted

## 2017-11-13 ENCOUNTER — Encounter (HOSPITAL_COMMUNITY)
Admission: RE | Disposition: A | Discharge status: Home / Self Care | Payer: Self-pay | Source: Home / Self Care | Attending: Obstetrics and Gynecology

## 2017-11-13 ENCOUNTER — Inpatient Hospital Stay (HOSPITAL_COMMUNITY)
Admission: RE | Admit: 2017-11-13 | Discharge: 2017-11-15 | DRG: 786 | Disposition: A | Discharge status: Home / Self Care | Payer: BLUE CROSS/BLUE SHIELD | Attending: Obstetrics and Gynecology | Admitting: Obstetrics and Gynecology

## 2017-11-13 DIAGNOSIS — D259 Leiomyoma of uterus, unspecified: Secondary | ICD-10-CM | POA: Diagnosis present

## 2017-11-13 DIAGNOSIS — O9902 Anemia complicating childbirth: Secondary | ICD-10-CM | POA: Diagnosis present

## 2017-11-13 DIAGNOSIS — O34211 Maternal care for low transverse scar from previous cesarean delivery: Principal | ICD-10-CM | POA: Diagnosis present

## 2017-11-13 DIAGNOSIS — O3413 Maternal care for benign tumor of corpus uteri, third trimester: Secondary | ICD-10-CM | POA: Diagnosis present

## 2017-11-13 DIAGNOSIS — D649 Anemia, unspecified: Secondary | ICD-10-CM | POA: Diagnosis present

## 2017-11-13 DIAGNOSIS — O3433 Maternal care for cervical incompetence, third trimester: Secondary | ICD-10-CM | POA: Diagnosis present

## 2017-11-13 DIAGNOSIS — O328XX0 Maternal care for other malpresentation of fetus, not applicable or unspecified: Secondary | ICD-10-CM | POA: Diagnosis present

## 2017-11-13 DIAGNOSIS — Z3A39 39 weeks gestation of pregnancy: Secondary | ICD-10-CM

## 2017-11-13 DIAGNOSIS — O99824 Streptococcus B carrier state complicating childbirth: Secondary | ICD-10-CM | POA: Diagnosis present

## 2017-11-13 DIAGNOSIS — Z98891 History of uterine scar from previous surgery: Secondary | ICD-10-CM

## 2017-11-13 HISTORY — PX: CERVICAL CERCLAGE: SHX1329

## 2017-11-13 LAB — CBC
HCT: 27.3 % — ABNORMAL LOW (ref 36.0–46.0)
HEMOGLOBIN: 9.6 g/dL — AB (ref 12.0–15.0)
MCH: 33 pg (ref 26.0–34.0)
MCHC: 35.2 g/dL (ref 30.0–36.0)
MCV: 93.8 fL (ref 78.0–100.0)
Platelets: 126 10*3/uL — ABNORMAL LOW (ref 150–400)
RBC: 2.91 MIL/uL — ABNORMAL LOW (ref 3.87–5.11)
RDW: 13.9 % (ref 11.5–15.5)
WBC: 7.5 10*3/uL (ref 4.0–10.5)

## 2017-11-13 SURGERY — Surgical Case
Anesthesia: Spinal | Wound class: Clean Contaminated

## 2017-11-13 MED ORDER — MENTHOL 3 MG MT LOZG: 1.0000 | LOZENGE | OROMUCOSAL | Status: DC | PRN

## 2017-11-13 MED ORDER — NALBUPHINE HCL 10 MG/ML IJ SOLN: 5.0000 mg | INTRAMUSCULAR | Status: DC | PRN

## 2017-11-13 MED ORDER — FAMOTIDINE 20 MG PO TABS
20.0000 mg | ORAL_TABLET | Freq: Once | ORAL | Status: AC
  Administered 2017-11-13: 20 mg via ORAL
  Filled 2017-11-13: qty 1

## 2017-11-13 MED ORDER — BUPIVACAINE IN DEXTROSE 0.75-8.25 % IT SOLN
INTRATHECAL | Status: DC | PRN
  Administered 2017-11-13: 1.6 mL via INTRATHECAL

## 2017-11-13 MED ORDER — SCOPOLAMINE 1 MG/3DAYS TD PT72
1.0000 | MEDICATED_PATCH | Freq: Once | TRANSDERMAL | Status: DC
  Filled 2017-11-13: qty 1

## 2017-11-13 MED ORDER — SIMETHICONE 80 MG PO CHEW
80.0000 mg | CHEWABLE_TABLET | ORAL | Status: DC
  Administered 2017-11-13: 80 mg via ORAL
  Filled 2017-11-13 (×2): qty 1

## 2017-11-13 MED ORDER — DIPHENHYDRAMINE HCL 25 MG PO CAPS: 25.0000 mg | ORAL_CAPSULE | ORAL | Status: DC | PRN

## 2017-11-13 MED ORDER — FENTANYL CITRATE (PF) 100 MCG/2ML IJ SOLN
INTRAMUSCULAR | Status: AC
  Filled 2017-11-13: qty 2

## 2017-11-13 MED ORDER — COCONUT OIL OIL: 1.0000 "application " | TOPICAL_OIL | Status: DC | PRN

## 2017-11-13 MED ORDER — MORPHINE SULFATE (PF) 0.5 MG/ML IJ SOLN
INTRAMUSCULAR | Status: DC | PRN
  Administered 2017-11-13: .2 mg via INTRATHECAL

## 2017-11-13 MED ORDER — LACTATED RINGERS IV SOLN
INTRAVENOUS | Status: DC
  Administered 2017-11-13 (×3): via INTRAVENOUS

## 2017-11-13 MED ORDER — FENTANYL CITRATE (PF) 100 MCG/2ML IJ SOLN: 25.0000 ug | INTRAMUSCULAR | Status: DC | PRN

## 2017-11-13 MED ORDER — PHENYLEPHRINE 8 MG IN D5W 100 ML (0.08MG/ML) PREMIX OPTIME
INJECTION | INTRAVENOUS | Status: AC
  Filled 2017-11-13: qty 100

## 2017-11-13 MED ORDER — FENTANYL CITRATE (PF) 100 MCG/2ML IJ SOLN
INTRAMUSCULAR | Status: DC | PRN
  Administered 2017-11-13: 10 ug via INTRATHECAL

## 2017-11-13 MED ORDER — ONDANSETRON HCL 4 MG/2ML IJ SOLN: 4.0000 mg | Freq: Three times a day (TID) | INTRAMUSCULAR | Status: DC | PRN

## 2017-11-13 MED ORDER — FERROUS SULFATE 325 (65 FE) MG PO TABS
325.0000 mg | ORAL_TABLET | Freq: Two times a day (BID) | ORAL | Status: DC
  Administered 2017-11-14 – 2017-11-15 (×3): 325 mg via ORAL
  Filled 2017-11-13 (×3): qty 1

## 2017-11-13 MED ORDER — TETANUS-DIPHTH-ACELL PERTUSSIS 5-2.5-18.5 LF-MCG/0.5 IM SUSP: 0.5000 mL | Freq: Once | INTRAMUSCULAR | Status: DC

## 2017-11-13 MED ORDER — WITCH HAZEL-GLYCERIN EX PADS: 1.0000 "application " | MEDICATED_PAD | CUTANEOUS | Status: DC | PRN

## 2017-11-13 MED ORDER — DIPHENHYDRAMINE HCL 50 MG/ML IJ SOLN
12.5000 mg | INTRAMUSCULAR | Status: DC | PRN
  Administered 2017-11-13: 12.5 mg via INTRAVENOUS
  Filled 2017-11-13: qty 1

## 2017-11-13 MED ORDER — LACTATED RINGERS IV SOLN
INTRAVENOUS | Status: DC
  Administered 2017-11-13: 14:00:00 via INTRAVENOUS

## 2017-11-13 MED ORDER — ONDANSETRON HCL 4 MG/2ML IJ SOLN
INTRAMUSCULAR | Status: AC
  Filled 2017-11-13: qty 2

## 2017-11-13 MED ORDER — MORPHINE SULFATE (PF) 0.5 MG/ML IJ SOLN
INTRAMUSCULAR | Status: AC
  Filled 2017-11-13: qty 10

## 2017-11-13 MED ORDER — OXYCODONE HCL 5 MG PO TABS: 5.0000 mg | ORAL_TABLET | Freq: Once | ORAL | Status: DC | PRN

## 2017-11-13 MED ORDER — KETOROLAC TROMETHAMINE 30 MG/ML IJ SOLN: 30.0000 mg | Freq: Four times a day (QID) | INTRAMUSCULAR | Status: AC | PRN

## 2017-11-13 MED ORDER — SCOPOLAMINE 1 MG/3DAYS TD PT72
MEDICATED_PATCH | TRANSDERMAL | Status: AC
  Administered 2017-11-13: 1.5 mg via TRANSDERMAL
  Filled 2017-11-13: qty 1

## 2017-11-13 MED ORDER — ZOLPIDEM TARTRATE 5 MG PO TABS: 5.0000 mg | ORAL_TABLET | Freq: Every evening | ORAL | Status: DC | PRN

## 2017-11-13 MED ORDER — SIMETHICONE 80 MG PO CHEW
80.0000 mg | CHEWABLE_TABLET | ORAL | Status: DC | PRN
  Administered 2017-11-13: 80 mg via ORAL

## 2017-11-13 MED ORDER — ONDANSETRON HCL 4 MG/2ML IJ SOLN
INTRAMUSCULAR | Status: DC | PRN
  Administered 2017-11-13: 4 mg via INTRAVENOUS

## 2017-11-13 MED ORDER — PRENATAL MULTIVITAMIN CH
1.0000 | ORAL_TABLET | Freq: Every day | ORAL | Status: DC
  Administered 2017-11-14 – 2017-11-15 (×2): 1 via ORAL
  Filled 2017-11-13 (×2): qty 1

## 2017-11-13 MED ORDER — LACTATED RINGERS IV SOLN
INTRAVENOUS | Status: DC | PRN
  Administered 2017-11-13: 08:00:00 via INTRAVENOUS

## 2017-11-13 MED ORDER — SODIUM CHLORIDE 0.9% FLUSH: 3.0000 mL | INTRAVENOUS | Status: DC | PRN

## 2017-11-13 MED ORDER — SOD CITRATE-CITRIC ACID 500-334 MG/5ML PO SOLN
ORAL | Status: AC
  Administered 2017-11-13: 30 mL via ORAL
  Filled 2017-11-13: qty 15

## 2017-11-13 MED ORDER — OXYTOCIN 10 UNIT/ML IJ SOLN
INTRAMUSCULAR | Status: DC | PRN
  Administered 2017-11-13: 40 [IU] via INTRAVENOUS

## 2017-11-13 MED ORDER — SCOPOLAMINE 1 MG/3DAYS TD PT72
1.0000 | MEDICATED_PATCH | Freq: Once | TRANSDERMAL | Status: DC
  Administered 2017-11-13: 1.5 mg via TRANSDERMAL

## 2017-11-13 MED ORDER — IBUPROFEN 600 MG PO TABS
600.0000 mg | ORAL_TABLET | Freq: Four times a day (QID) | ORAL | Status: DC
  Administered 2017-11-13 – 2017-11-15 (×8): 600 mg via ORAL
  Filled 2017-11-13 (×8): qty 1

## 2017-11-13 MED ORDER — DIPHENHYDRAMINE HCL 25 MG PO CAPS: 25.0000 mg | ORAL_CAPSULE | Freq: Four times a day (QID) | ORAL | Status: DC | PRN

## 2017-11-13 MED ORDER — ACETAMINOPHEN 325 MG PO TABS: 650.0000 mg | ORAL_TABLET | ORAL | Status: DC | PRN

## 2017-11-13 MED ORDER — NALBUPHINE HCL 10 MG/ML IJ SOLN
5.0000 mg | Freq: Once | INTRAMUSCULAR | Status: DC | PRN
Start: 2017-11-13 — End: 2017-11-15

## 2017-11-13 MED ORDER — PROMETHAZINE HCL 25 MG/ML IJ SOLN: 6.2500 mg | INTRAMUSCULAR | Status: DC | PRN

## 2017-11-13 MED ORDER — OXYCODONE HCL 5 MG/5ML PO SOLN: 5.0000 mg | Freq: Once | ORAL | Status: DC | PRN

## 2017-11-13 MED ORDER — OXYTOCIN 40 UNITS IN LACTATED RINGERS INFUSION - SIMPLE MED: 2.5000 [IU]/h | INTRAVENOUS | Status: AC

## 2017-11-13 MED ORDER — OXYCODONE HCL 5 MG PO TABS: 10.0000 mg | ORAL_TABLET | ORAL | Status: DC | PRN

## 2017-11-13 MED ORDER — OXYCODONE HCL 5 MG PO TABS: 5.0000 mg | ORAL_TABLET | ORAL | Status: DC | PRN

## 2017-11-13 MED ORDER — DIBUCAINE 1 % RE OINT: 1.0000 "application " | TOPICAL_OINTMENT | RECTAL | Status: DC | PRN

## 2017-11-13 MED ORDER — CEFAZOLIN SODIUM-DEXTROSE 2-4 GM/100ML-% IV SOLN
2.0000 g | INTRAVENOUS | Status: AC
  Administered 2017-11-13: 2 g via INTRAVENOUS

## 2017-11-13 MED ORDER — SIMETHICONE 80 MG PO CHEW
80.0000 mg | CHEWABLE_TABLET | Freq: Three times a day (TID) | ORAL | Status: DC
  Administered 2017-11-14 – 2017-11-15 (×6): 80 mg via ORAL
  Filled 2017-11-13 (×6): qty 1

## 2017-11-13 MED ORDER — ALBUMIN HUMAN 5 % IV SOLN
INTRAVENOUS | Status: DC | PRN
  Administered 2017-11-13 (×2): via INTRAVENOUS

## 2017-11-13 MED ORDER — MEPERIDINE HCL 25 MG/ML IJ SOLN
6.2500 mg | INTRAMUSCULAR | Status: DC | PRN
Start: 2017-11-13 — End: 2017-11-13

## 2017-11-13 MED ORDER — MEDROXYPROGESTERONE ACETATE 150 MG/ML IM SUSP: 150.0000 mg | INTRAMUSCULAR | Status: DC | PRN

## 2017-11-13 MED ORDER — SOD CITRATE-CITRIC ACID 500-334 MG/5ML PO SOLN
30.0000 mL | Freq: Once | ORAL | Status: AC
  Administered 2017-11-13: 30 mL via ORAL

## 2017-11-13 MED ORDER — SENNOSIDES-DOCUSATE SODIUM 8.6-50 MG PO TABS
2.0000 | ORAL_TABLET | ORAL | Status: DC
  Administered 2017-11-13 – 2017-11-14 (×2): 2 via ORAL
  Filled 2017-11-13 (×2): qty 2

## 2017-11-13 MED ORDER — NALOXONE HCL 4 MG/10ML IJ SOLN
1.0000 ug/kg/h | INTRAMUSCULAR | Status: DC | PRN
  Filled 2017-11-13: qty 5

## 2017-11-13 MED ORDER — OXYTOCIN 10 UNIT/ML IJ SOLN
INTRAMUSCULAR | Status: AC
  Filled 2017-11-13: qty 4

## 2017-11-13 MED ORDER — PHENYLEPHRINE 8 MG IN D5W 100 ML (0.08MG/ML) PREMIX OPTIME
INJECTION | INTRAVENOUS | Status: DC | PRN
  Administered 2017-11-13: 60 ug/min via INTRAVENOUS

## 2017-11-13 MED ORDER — NALOXONE HCL 0.4 MG/ML IJ SOLN: 0.4000 mg | INTRAMUSCULAR | Status: DC | PRN

## 2017-11-13 SURGICAL SUPPLY — 34 items
APL SKNCLS STERI-STRIP NONHPOA (GAUZE/BANDAGES/DRESSINGS) ×1
BENZOIN TINCTURE PRP APPL 2/3 (GAUZE/BANDAGES/DRESSINGS) ×2 IMPLANT
CHLORAPREP W/TINT 26ML (MISCELLANEOUS) ×2 IMPLANT
CLAMP CORD UMBIL (MISCELLANEOUS) IMPLANT
CLOTH BEACON ORANGE TIMEOUT ST (SAFETY) ×2 IMPLANT
DRSG OPSITE POSTOP 4X10 (GAUZE/BANDAGES/DRESSINGS) ×2 IMPLANT
ELECT REM PT RETURN 9FT ADLT (ELECTROSURGICAL) ×2
ELECTRODE REM PT RTRN 9FT ADLT (ELECTROSURGICAL) ×1 IMPLANT
EXTRACTOR VACUUM M CUP 4 TUBE (SUCTIONS) IMPLANT
GLOVE BIO SURGEON STRL SZ7.5 (GLOVE) ×2 IMPLANT
GLOVE BIOGEL PI IND STRL 7.0 (GLOVE) ×1 IMPLANT
GLOVE BIOGEL PI IND STRL 7.5 (GLOVE) ×1 IMPLANT
GLOVE BIOGEL PI INDICATOR 7.0 (GLOVE) ×1
GLOVE BIOGEL PI INDICATOR 7.5 (GLOVE) ×1
GOWN STRL REUS W/TWL LRG LVL3 (GOWN DISPOSABLE) ×4 IMPLANT
KIT ABG SYR 3ML LUER SLIP (SYRINGE) IMPLANT
NDL HYPO 25X5/8 SAFETYGLIDE (NEEDLE) IMPLANT
NEEDLE HYPO 25X5/8 SAFETYGLIDE (NEEDLE) IMPLANT
NS IRRIG 1000ML POUR BTL (IV SOLUTION) ×2 IMPLANT
PACK C SECTION WH (CUSTOM PROCEDURE TRAY) ×2 IMPLANT
PAD OB MATERNITY 4.3X12.25 (PERSONAL CARE ITEMS) ×2 IMPLANT
PENCIL SMOKE EVAC W/HOLSTER (ELECTROSURGICAL) ×2 IMPLANT
RTRCTR C-SECT PINK 25CM LRG (MISCELLANEOUS) ×2 IMPLANT
STRIP CLOSURE SKIN 1/2X4 (GAUZE/BANDAGES/DRESSINGS) ×2 IMPLANT
SUT CHROMIC 2 0 CT 1 (SUTURE) ×2 IMPLANT
SUT MNCRL AB 3-0 PS2 27 (SUTURE) ×2 IMPLANT
SUT PLAIN 2 0 XLH (SUTURE) ×2 IMPLANT
SUT VIC AB 0 CT1 36 (SUTURE) ×2 IMPLANT
SUT VIC AB 0 CTX 36 (SUTURE) ×8
SUT VIC AB 0 CTX36XBRD ANBCTRL (SUTURE) ×3 IMPLANT
SUT VIC AB 2-0 SH 27 (SUTURE) ×4
SUT VIC AB 2-0 SH 27XBRD (SUTURE) ×2 IMPLANT
TOWEL OR 17X24 6PK STRL BLUE (TOWEL DISPOSABLE) ×2 IMPLANT
TRAY FOLEY W/BAG SLVR 14FR LF (SET/KITS/TRAYS/PACK) ×2 IMPLANT

## 2017-11-13 NOTE — Addendum Note (Signed)
Addendum  created 11/13/17 1955 by Ignacia Bayley, CRNA   Sign clinical note

## 2017-11-13 NOTE — Anesthesia Procedure Notes (Signed)
Spinal  Patient location during procedure: OR Start time: 11/13/2017 7:32 AM End time: 11/13/2017 7:36 AM Staffing Anesthesiologist: Audry Pili, MD Performed: anesthesiologist  Preanesthetic Checklist Completed: patient identified, surgical consent, pre-op evaluation, timeout performed, IV checked, risks and benefits discussed and monitors and equipment checked Spinal Block Patient position: sitting Prep: DuraPrep Patient monitoring: heart rate, cardiac monitor, continuous pulse ox and blood pressure Approach: midline Location: L2-3 Injection technique: single-shot Needle Needle type: Pencan  Needle gauge: 24 G Additional Notes Functioning IV was confirmed and monitors were applied. Sterile prep and drape, including hand hygiene, mask, and sterile gloves were used. The patient was positioned and the spine was prepped. The skin was anesthetized with lidocaine. Free flow of clear CSF was obtained prior to injecting local anesthetic into the CSF. The spinal needle aspirated freely following injection. The needle was carefully withdrawn. The patient tolerated the procedure well. Consent was obtained prior to the procedure with all questions answered and concerns addressed. Risks including, but not limited to, bleeding, infection, nerve damage, paralysis, failed block, inadequate analgesia, allergic reaction, high spinal, itching, and headache were discussed and the patient wished to proceed.  Tamara Don, MD

## 2017-11-13 NOTE — Anesthesia Postprocedure Evaluation (Signed)
Anesthesia Post Note  Patient: Tamara Lee  Procedure(s) Performed: REPEAT CESAREAN SECTION AND REMOVAL OF CERVICAL CERCLAGE (N/A ) CERCLAGE CERVICAL--Removal (N/A )     Patient location during evaluation: PACU Anesthesia Type: Spinal Level of consciousness: awake and alert Pain management: pain level controlled Vital Signs Assessment: post-procedure vital signs reviewed and stable Respiratory status: spontaneous breathing and respiratory function stable Cardiovascular status: stable Postop Assessment: spinal receding and no apparent nausea or vomiting Anesthetic complications: no    Last Vitals:  Vitals:   11/13/17 1216 11/13/17 1315  BP: (!) 94/59 (!) 90/50  Pulse: 70 69  Resp: 18 18  Temp:  36.5 C  SpO2: 100% 99%    Last Pain:  Vitals:   11/13/17 1315  TempSrc: Oral  PainSc:    Pain Goal:                 Audry Pili

## 2017-11-13 NOTE — Transfer of Care (Signed)
Immediate Anesthesia Transfer of Care Note  Patient: Tamara Lee  Procedure(s) Performed: REPEAT CESAREAN SECTION AND REMOVAL OF CERVICAL CERCLAGE (N/A ) CERCLAGE CERVICAL--Removal (N/A )  Patient Location: PACU  Anesthesia Type:Spinal  Level of Consciousness: awake, alert , oriented and patient cooperative  Airway & Oxygen Therapy: Patient Spontanous Breathing  Post-op Assessment: Report given to RN and Post -op Vital signs reviewed and stable  Post vital signs: Reviewed and stable  Last Vitals:  Vitals Value Taken Time  BP 94/66 11/13/2017  9:04 AM  Temp    Pulse 86 11/13/2017  9:07 AM  Resp 12 11/13/2017  9:07 AM  SpO2 96 % 11/13/2017  9:07 AM  Vitals shown include unvalidated device data.  Last Pain:  Vitals:   11/13/17 0605  TempSrc: Oral  PainSc: 0-No pain         Complications: No apparent anesthesia complications

## 2017-11-13 NOTE — Lactation Note (Signed)
This note was copied from a baby's chart. Lactation Consultation Note  Patient Name: Tamara Lee Date: 11/13/2017 Reason for consult: Initial assessment;Term  P2 mother whose infant is now 47 hours old.  Mother has an 42 year old son.  Baby in bassinet and sleeping as I arrived.  RN in room helping assist mother up.   Mother is requesting to start pumping in the hospital.  Initiated a DEBP for her with instructions on pump parts, assembly, disassembly and cleaning.  Mother will begin pumping at her discretion this evening.  Encouraged her to feed baby 8-12 times/24 hours or sooner if she shows feeding cues.  Reviewed feeding cues with mother.  Encouraged lots or STS, breast massage and hand expression before and after feedings.  Colostrum container and spoon provided with instructions for use.  Mother stated that her RN showed her how to hand express and she has no questions.  . Mom made aware of O/P services, breastfeeding support groups, community resources, and our phone # for post-discharge questions.  Mother will call for latch assistance as needed.  Her son is present.  RN updated.    Maternal Data Formula Feeding for Exclusion: No Has patient been taught Hand Expression?: Yes  Lactation Tools Discussed/Used WIC Program: No Pump Review: Setup, frequency, and cleaning;Milk Storage Initiated by:: Breon Rehm Date initiated:: 11/14/17   Consult Status Consult Status: Follow-up Date: 11/14/17 Follow-up type: In-patient    Tamara Lee 11/13/2017, 6:39 PM

## 2017-11-13 NOTE — Progress Notes (Signed)
Call bell explained, feeding cues explained bulb suction explained. Patient told not to fall asleep with infant. Color changes explained. Incentive spriometer encouraged and explained. Plan of care explained.

## 2017-11-13 NOTE — Anesthesia Postprocedure Evaluation (Signed)
Anesthesia Post Note  Patient: Tamara Lee  Procedure(s) Performed: REPEAT CESAREAN SECTION AND REMOVAL OF CERVICAL CERCLAGE (N/A ) CERCLAGE CERVICAL--Removal (N/A )     Patient location during evaluation: Mother Baby Anesthesia Type: Spinal Level of consciousness: awake Pain management: pain level controlled Vital Signs Assessment: post-procedure vital signs reviewed and stable Respiratory status: spontaneous breathing Cardiovascular status: stable Postop Assessment: no headache, no backache, spinal receding, patient able to bend at knees, no apparent nausea or vomiting, adequate PO intake and able to ambulate Anesthetic complications: no    Last Vitals:  Vitals:   11/13/17 1816 11/13/17 1817  BP: (!) 101/56 (!) 98/59  Pulse: (!) 107 99  Resp: 18 18  Temp:    SpO2:  100%    Last Pain:  Vitals:   11/13/17 1815  TempSrc: Oral  PainSc:    Pain Goal:                 [name]

## 2017-11-13 NOTE — Op Note (Addendum)
Cesarean Section Procedure Note  Indications: P1 at 12 1/7wks presenting for elective repeat c-section and cerclage removal  Pre-operative Diagnosis: 1.39 1/7wks 2.Prior Cesarean Section 3.h/o Incompetent cervix   Post-operative Diagnosis: 1.39 1/7wks 2.Prior Cesarean Section 3.h/o Incompetent cervix 4. Breech Presentation 5.Fibroids  Procedure: REPEAT LOW TRANSVERSE CESAREAN SECTION and REMOVAL OF CERCLAGE  Surgeon: Everett Graff, MD    Assistants: Noralyn Pick, CNM  Anesthesia: Spinal  Procedure Details  The patient was taken to the operating room secondary to h/o c-section and p after the risks, benefits, complications, treatment options, and expected outcomes were discussed with the patient.  The patient concurred with the proposed plan, giving informed consent which was signed and witnessed. The patient was taken to Operating Room C-Section Suite/Room Nine, identified as BEE MARCHIANO and the procedure verified as C-Section Delivery. A Time Out was held and the above information confirmed.  After induction of anesthesia by obtaining a spinal, the patient was prepped and draped in the usual sterile manner. A Pfannenstiel skin incision was made and carried down through the subcutaneous tissue to the underlying layer of fascia.  The fascia was incised bilaterally and extended transversely bilaterally with the Mayo scissors. Kocher clamps were placed on the inferior aspect of the fascial incision and the underlying rectus muscle was separated from the fascia. The same was done on the superior aspect of the fascial incision.  The peritoneum was identified, entered bluntly and extended manually.  An Alexis self-retaining retractor was placed.  The utero-vesical peritoneal reflection was incised transversely and the bladder flap was bluntly freed from the lower uterine segment. A low transverse uterine incision was made with the scalpel and extended bilaterally with the bandage scissors.   There were large venous sinuses that involved the lower uterine segment and started to bleed immediately. The infant was delivered from a footling breech presentation via breech extraction without difficulty.  After the umbilical cord was clamped and cut, the infant was handed to the awaiting pediatricians.  Cord blood was obtained for evaluation.  The placenta was removed intact and appeared to be within normal limits. The uterus was cleared of all clots and debris. There was a venous sinus bleeding on the posterior wall of the uterus and an interrupted stitch of 0 vicryl was placed.  The uterine incision was closed with running interlocking sutures of 0 Vicryl and a second imbricating layer was performed as well.   Bilateral tubes and ovaries appeared to be within normal limits.  A figure of eight stitch of 0 vicryl was placed on an area of bleeding left of the midline and good hemostasis was noted.  Copious irrigation was performed until clear.  The peritoneum was repaired with 2-0 chromic via a running suture.  The fascia was reapproximated with a running suture of 0 Vicryl. The subcutaneous tissue was reapproximated with 3 interrupted sutures of 2-0 plain.  The skin was reapproximated with a subcuticular suture of 3-0 monocryl.  Steristrips were applied.  Instrument, sponge, and needle counts were correct prior to abdominal closure and at the conclusion of the case.  The patient was awaiting transfer to the recovery room in good condition.  The patient was placed in stirrups, speculum placed, cerclage stitch grasped with ringed forceps and stitch excised and removed without difficulty.  Findings: Live female infant with Apgars 8 at one minute and 9 at five minutes.  Normal appearing bilateral ovaries and fallopian tubes were noted.  Estimated Blood Loss:  1700 ml  Drains: foley to gravity 50 cc         Total IV Fluids: 3500 ml crystalloid 500cc Albumin         Specimens to Pathology:  Placenta         Complications:  None; patient tolerated the procedure well.         Disposition: PACU - hemodynamically stable.         Condition: stable  Attending Attestation: I performed the procedure.

## 2017-11-14 LAB — CBC
HCT: 25.8 % — ABNORMAL LOW (ref 36.0–46.0)
HEMOGLOBIN: 8.9 g/dL — AB (ref 12.0–15.0)
MCH: 32.6 pg (ref 26.0–34.0)
MCHC: 34.5 g/dL (ref 30.0–36.0)
MCV: 94.5 fL (ref 78.0–100.0)
Platelets: 142 10*3/uL — ABNORMAL LOW (ref 150–400)
RBC: 2.73 MIL/uL — ABNORMAL LOW (ref 3.87–5.11)
RDW: 14 % (ref 11.5–15.5)
WBC: 10.8 10*3/uL — ABNORMAL HIGH (ref 4.0–10.5)

## 2017-11-14 NOTE — Lactation Note (Signed)
This note was copied from a baby's chart. Lactation Consultation Note  Patient Name: Girl Rella Egelston [id number] Date: 11/14/2017 Reason for consult: Follow-up assessment;Term  P2 mother whose infant is now 35 hours old.  Mother was holding baby as I arrived and baby showing feeding cues.  Mother stated that she just fed her 1/2 hour ago.  However, I pointed out the cues and offered to assist with latch and mother accepted.  Assisted to latch in the football hold on the left breast without difficulty.  Baby had wide mouth, flanged lips and good rhythmic sucking.  A few audible swallows were noted.  Instructed and demonstrated breast compressions during feeding and mother did a return demonstration.  Mother stated the latch felt better than it had been feeling.  I pointed out how to obtain and maintain a deep latch which I believed she was lacking.  Encouraged feeding 8-12 times/24 hours or sooner if baby shows feeding cues.  Reviewed feeding cues.  Continue STS, breast massage and hand expression after feedings.  Mother will call for questions/concerns as needed.     Maternal Data Formula Feeding for Exclusion: No Has patient been taught Hand Expression?: Yes Does the patient have breastfeeding experience prior to this delivery?: Yes  Feeding Feeding Type: Breast Fed Length of feed: 5 min(still feeding when I left the room)  LATCH Score Latch: Grasps breast easily, tongue down, lips flanged, rhythmical sucking.  Audible Swallowing: A few with stimulation  Type of Nipple: Everted at rest and after stimulation  Comfort (Breast/Nipple): Soft / non-tender  Hold (Positioning): No assistance needed to correctly position infant at breast.  LATCH Score: 9  Interventions Interventions: Breast feeding basics reviewed;Assisted with latch;Skin to skin;Breast massage;Hand express;Position options;Support pillows;Adjust position;Breast compression  Lactation Tools Discussed/Used      Consult Status Consult Status: Follow-up Date: 11/15/17 Follow-up type: In-patient    Emanuela Runnion R Emmanuela Ghazi 11/14/2017, 1:01 PM

## 2017-11-14 NOTE — Progress Notes (Signed)
Subjective: Postpartum Day 1:  Repeat Cesarean Delivery Patient reports tolerating PO and no problems voiding.    Objective: Vital signs in last 24 hours: Temp:  [97.6 F (36.4 C)-98.4 F (36.9 C)] 98.2 F (36.8 C) (08/01 0620) Pulse Rate:  [59-107] 76 (08/01 0620) Resp:  [13-20] 18 (08/01 0620) BP: (88-106)/(50-68) 96/63 (08/01 0620) SpO2:  [94 %-100 %] 98 % (08/01 0620)  Physical Exam:  General: alert, cooperative and appears stated age Lochia: appropriate Uterine Fundus: firm Incision: no significant drainage DVT Evaluation: No evidence of DVT seen on physical exam.  Recent Labs    11/13/17 1015 11/14/17 0617  HGB 9.6* 8.9*  HCT 27.3* 25.8*    Assessment/Plan: Status post Cesarean section. Doing well postoperatively.  Continue current care.  Lori A Clemmons CNM 11/14/2017, 8:21 AM

## 2017-11-15 ENCOUNTER — Ambulatory Visit: Payer: Self-pay

## 2017-11-15 MED ORDER — IBUPROFEN 600 MG PO TABS: 600.0000 mg | ORAL_TABLET | Freq: Four times a day (QID) | ORAL | 0 refills | Status: AC

## 2017-11-15 NOTE — Discharge Instructions (Signed)
Postpartum Care After Cesarean Delivery °The period of time right after you deliver your newborn is called the postpartum period. °What kind of medical care will I receive? °· You may continue to receive fluids and medicines through an IV tube inserted into one of your veins. °· You may have small, flexible tube (catheter) draining urine from your bladder into a bag outside of your body. The catheter will be removed as soon as possible. °· You may be given a squirt bottle to use when you go to the bathroom. You may use this until you are comfortable wiping as usual. To use the squirt bottle, follow these steps: °? Before you urinate, fill the squirt bottle with warm water. The water should be warm. Do not use hot water. °? After you urinate, while you are sitting on the toilet, use the squirt bottle to rinse the area around your urethra and vaginal opening. This rinses away any urine and blood. °? You may do this instead of wiping. As you start healing, you may use the squirt bottle before wiping yourself. Make sure to wipe gently. °? Fill the squirt bottle with clean water every time you use the bathroom. °· You will be given sanitary pads to wear. °· Your incision will be monitored to make sure it is healing properly. You will be told when it is safe for your stitches, staples, or skin adhesive tape to be removed. °What can I expect? °· You may not feel the need to urinate for several hours after delivery. °· You will have some soreness and pain in your abdomen. You may have a small amount of blood or clear fluid coming from your incision. °· If you are breastfeeding, you may have uterine contractions every time you breastfeed for up to several weeks postpartum. Uterine contractions help your uterus return to its normal size. °· It is normal to have vaginal bleeding (lochia) after delivery. The amount and appearance of lochia is often similar to a menstrual period in the first week after delivery. It will  gradually decrease over the next few weeks to a dry, yellow-brown discharge. For most women, lochia stops completely by 6-8 weeks after delivery. Vaginal bleeding can vary from woman to woman. °· Within the first few days after delivery, you may have breast engorgement. This is when your breasts feel heavy, full, and uncomfortable. Your breasts may also throb and feel hard, tightly stretched, warm, and tender. After this occurs, you may have milk leaking from your breasts. Your health care provider can help you relieve discomfort due to breast engorgement. Breast engorgement should go away within a few days. °· You may feel more sad or worried than normal due to hormonal changes after delivery. These feelings should not last more than a few days. If these feelings do not go away after several days, speak with your health care provider. °How should I care for myself? °· Tell your health care provider if you have pain or discomfort. °· Drink enough water to keep your urine clear or pale yellow. °· Wash your hands thoroughly with soap and water for at least 20 seconds after changing your sanitary pads or using the toilet, and before holding or feeding your baby. °· If you are not breastfeeding, avoid touching your breasts a lot. Doing this can make your breasts produce more milk. °· If you become weak or lightheaded, or you feel like you might faint, ask for help before: °? Getting out of bed. °? Showering. °·   Change your sanitary pads frequently. Watch for any changes in your flow, such as a sudden increase in volume, a change in color, or the passing of large blood clots. If you pass a blood clot from your vagina, save it to show to your health care provider. Do not flush blood clots down the toilet without having your health care provider look at them. °· Make sure that all your vaccinations are up to date. This can help protect you and your baby from getting certain diseases. You may need to have immunizations done  before you leave the hospital. °· If desired, talk with your health care provider about methods of family planning or birth control (contraception). °How can I start bonding with my baby? °Spending as much time as possible with your baby is very important. During this time, you and your baby can get to know each other and develop a bond. Having your baby stay with you in your room (rooming in) can give you time to get to know your baby. Rooming in can also help you become comfortable caring for your baby. Breastfeeding can also help you bond with your baby. °How can I plan for returning home with my baby? °· Make sure that you have a car seat installed in your vehicle. °? Your car seat should be checked by a certified car seat installer to make sure that it is installed safely. °? Make sure that your baby fits into the car seat safely. °· Ask your health care provider any questions you have about caring for yourself or your baby. Make sure that you are able to contact your health care provider with any questions after leaving the hospital. °This information is not intended to replace advice given to you by your health care provider. Make sure you discuss any questions you have with your health care provider. °Document Released: 12/26/2011 Document Revised: 09/05/2015 Document Reviewed: 03/07/2015 °Elsevier Interactive Patient Education © 2018 [employer] Inc[employer] °Home Care Instructions for Mom °ACTIVITY °· Gradually return to your regular activities. °· Let yourself rest. Nap while your baby sleeps. °· Avoid lifting anything that is heavier than 10 lb (4.5 kg) until your health care provider says it is okay. °· Avoid activities that take a lot of effort and energy (are strenuous) until approved by your health care provider. Walking at a slow-to-moderate pace is usually safe. °· If you had a cesarean delivery: °? Do not vacuum, climb stairs, or drive a car for 4-6 weeks. °? Have someone help you at home until you feel like  you can do your usual activities yourself. °? Do exercises as told by your health care provider, if this applies. ° °VAGINAL BLEEDING °You may continue to bleed for 4-6 weeks after delivery. Over time, the amount of blood usually decreases and the color of the blood usually gets lighter. However, the flow of bright red blood may increase if you have been too active. If you need to use more than one pad in an hour because your pad gets soaked, or if you pass a large clot: °· Lie down. °· Raise your feet. °· Place a cold compress on your lower abdomen. °· Rest. °· Call your health care provider. ° °If you are breastfeeding, your period should return anytime between 8 weeks after delivery and the time that you stop breastfeeding. If you are not breastfeeding, your period should return 6-8 weeks after delivery. °PERINEAL CARE °The perineal area, or perineum, is the part of your body   between your thighs. After delivery, this area needs special care. Follow these instructions as told by your health care provider. °· Take warm tub baths for 15-20 minutes. °· Use medicated pads and pain-relieving sprays and creams as told. °· Do not use tampons or douches until vaginal bleeding has stopped. °· Each time you go to the bathroom: °? Use a peri bottle. °? Change your pad. °? Use towelettes in place of toilet paper until your stitches have healed. °· Do Kegel exercises every day. Kegel exercises help to maintain the muscles that support the vagina, bladder, and bowels. You can do these exercises while you are standing, sitting, or lying down. To do Kegel exercises: °? Tighten the muscles of your abdomen and the muscles that surround your birth canal. °? Hold for a few seconds. °? Relax. °? Repeat until you have done this 5 times in a row. °· To prevent hemorrhoids from developing or getting worse: °? Drink enough fluid to keep your urine clear or pale yellow. °? Avoid straining when having a bowel movement. °? Take  over-the-counter medicines and stool softeners as told by your health care provider. ° °BREAST CARE °· Wear a tight-fitting bra. °· Avoid taking over-the-counter pain medicine for breast discomfort. °· Apply ice to the breasts to help with discomfort as needed: °? Put ice in a plastic bag. °? Place a towel between your skin and the bag. °? Leave the ice on for 20 minutes or as told by your health care provider. ° °NUTRITION °· Eat a well-balanced diet. °· Do not try to lose weight quickly by cutting back on calories. °· Take your prenatal vitamins until your postpartum checkup or until your health care provider tells you to stop. ° °POSTPARTUM DEPRESSION °You may find yourself crying for no apparent reason and unable to cope with all of the changes that come with having a newborn. This mood is called postpartum depression. Postpartum depression happens because your hormone levels change after delivery. If you have postpartum depression, get support from your partner, friends, and family. If the depression does not go away on its own after several weeks, contact your health care provider. °BREAST SELF-EXAM °Do a breast self-exam each month, at the same time of the month. If you are breastfeeding, check your breasts just after a feeding, when your breasts are less full. If you are breastfeeding and your period has started, check your breasts on day 5, 6, or 7 of your period. °Report any lumps, bumps, or discharge to your health care provider. Know that breasts are normally lumpy if you are breastfeeding. This is temporary, and it is not a health risk. °INTIMACY AND SEXUALITY °Avoid sexual activity for at least 3-4 weeks after delivery or until the brownish-red vaginal flow is completely gone. If you want to avoid pregnancy, use some form of birth control. You can get pregnant after delivery, even if you have not had your period. °SEEK MEDICAL CARE IF: °· You feel unable to cope with the changes that a child brings to  your life, and these feelings do not go away after several weeks. °· You notice a lump, a bump, or discharge on your breast. ° °SEEK IMMEDIATE MEDICAL CARE IF: °· Blood soaks your pad in 1 hour or less. °· You have: °? Severe pain or cramping in your lower abdomen. °? A bad-smelling vaginal discharge. °? A fever that is not controlled by medicine. °? A fever, and an area of your breast is red   and sore. °? Pain or redness in your calf. °? Sudden, severe chest pain. °? Shortness of breath. °? Painful or bloody urination. °? Problems with your vision. °· You vomit for 12 hours or longer. °· You develop a severe headache. °· You have serious thoughts about hurting yourself, your child, or anyone else. ° °This information is not intended to replace advice given to you by your health care provider. Make sure you discuss any questions you have with your health care provider. °Document Released: 03/30/2000 Document Revised: 09/08/2015 Document Reviewed: 10/04/2014 °Elsevier Interactive Patient Education © 2017 Elsevier Inc. ° °

## 2017-11-15 NOTE — Lactation Note (Signed)
This note was copied from a baby's chart. Lactation Consultation Note  Patient Name: Tamara Lee [id number] Date: 11/15/2017    I noted that "Tamara Lee" was unable to maintain suction at breast. On oral exam, tongue elevation was noted to be extremely restricted. Infant with heart-shaped tongue & unable to maintain strong suction on finger (tongue kept "flapping" away from finger). Nipple shields were attempted to see if that would increase suction, but it only did so slightly. When infant released latch from the nipple shield, no colostrum was seen, only saliva. The "swallows" that were noted in the [facility] score were likely only swallows of saliva. Infant was reweighed & had lost an additional 2 oz over the last 12 hours.  Mom's breasts suggest insufficient glandular tissue (hypoplastic, nipples point downward & inward). She reports no breast changes with pregnancy, but says she feels a little fuller since birth. There is palpable duct tissue in breasts, but duct tissue does not extend to edges of breast shape. High inframammary fold noted bilaterally. I made no mention of this to mother.  Infant was bottle-fed with formula. Three different bottle nipples were attempted before one was found that infant could feed from without significant spillage from sides of the mouth. Sometimes infant needed jaw support b/c of excessive jaw excursions.  I assisted Mom with pumping and hand expression taught to Mom. Little colostrum was obtained.   Tamara Lee [facility] 11/15/2017, 7:34 PM

## 2017-11-15 NOTE — Lactation Note (Signed)
This note was copied from a baby's chart. Lactation Consultation Note  Patient Name: Tamara Lee [id number] Date: 11/15/2017 Reason for consult: Follow-up assessment;MD order  Mom has my # to call for assist w/next feeding.  Matthias Hughs [facility] 11/15/2017, 4:40 PM

## 2017-11-15 NOTE — Discharge Summary (Signed)
OB Discharge Summary     Patient Name: Tamara Lee DOB: 10-25-1975 MRN: [id number]  Date of admission: 11/13/2017 Delivering MD: Everett Graff   Date of discharge: 11/15/2017  Admitting diagnosis: Prior Cesarean Section, Breech Presentation Intrauterine pregnancy: [redacted]w[redacted]d     Secondary diagnosis:  Active Problems:   Encounter for cesarean delivery without indication   Status post repeat low transverse cesarean section  Additional problems:      Discharge diagnosis: Term Pregnancy Delivered                                                                                                Post partum procedures:  Augmentation:   Complications: None  Hospital course:  Sceduled C/S   42 y.o. yo G3P2002 at [redacted]w[redacted]d was admitted to the hospital 11/13/2017 for scheduled cesarean section with the following indication:Elective Repeat.  Membrane Rupture Time/Date: 8:00 AM ,11/13/2017   Patient delivered a Viable infant.11/13/2017  Details of operation can be found in separate operative note.  Pateint had an uncomplicated postpartum course.  She is ambulating, tolerating a regular diet, passing flatus, and urinating well. Patient is discharged home in stable condition on  11/15/17         Physical exam  Vitals:   11/14/17 0620 11/14/17 1403 11/14/17 2145 11/15/17 0532  BP: 96/63 101/63 (!) 120/55 109/77  Pulse: 76 89 90 84  Resp: 18 17 16 17   Temp: 98.2 F (36.8 C) 97.8 F (36.6 C) 98.4 F (36.9 C) 98.3 F (36.8 C)  TempSrc: Oral Oral Oral Oral  SpO2: 98% 100%  99%  Weight:      Height:       General: alert, cooperative and no distress Lochia: appropriate Uterine Fundus: firm Incision: Dressing is clean, dry, and intact DVT Evaluation: No evidence of DVT seen on physical exam. Labs: Lab Results  Component Value Date   WBC 10.8 (H) 11/14/2017   HGB 8.9 (L) 11/14/2017   HCT 25.8 (L) 11/14/2017   MCV 94.5 11/14/2017   PLT 142 (L) 11/14/2017   CMP Latest Ref Rng & Units  08/05/2017  Glucose 65 - 99 mg/dL 88  BUN 6 - 20 mg/dL 10  Creatinine 0.44 - 1.00 mg/dL 0.48  Sodium 135 - 145 mmol/L 135  Potassium 3.5 - 5.1 mmol/L 4.1  Chloride 101 - 111 mmol/L 105  CO2 22 - 32 mmol/L 20(L)  Calcium 8.9 - 10.3 mg/dL 8.6(L)  Total Protein 6.5 - 8.1 g/dL 7.0  Total Bilirubin 0.3 - 1.2 mg/dL 0.3  Alkaline Phos 38 - 126 U/L 71  AST 15 - 41 U/L 15  ALT 14 - 54 U/L 14    Discharge instruction: per After Visit Summary and "Baby and Me Booklet".  After visit meds:  Allergies as of 11/15/2017   No Known Allergies     Medication List    TAKE these medications   ibuprofen 600 MG tablet Commonly known as:  ADVIL,MOTRIN Take 1 tablet (600 mg total) by mouth every 6 (six) hours.   IRON PO Take 1 tablet by mouth daily.   PRENATAL  VITAMIN PO Take 1 tablet by mouth daily.       Diet: routine diet  Activity: Advance as tolerated. Pelvic rest for 6 weeks.   Outpatient follow up:2 weeks Follow up Appt:No future appointments. Follow up Visit:No follow-ups on file.  Postpartum contraception: Undecided  Newborn Data: Live born female  Birth Weight: 7 lb 15.2 oz (3605 g) APGAR: 8, 9  Newborn Delivery   Birth date/time:  11/13/2017 08:01:00 Delivery type:  C-Section, Low Transverse Trial of labor:  No C-section categorization:  Repeat     Baby Feeding: Breast Disposition:home with mother   11/15/2017 Larey Days, CNM

## 2017-11-15 NOTE — Lactation Note (Signed)
This note was copied from a baby's chart. Lactation Consultation Note  Patient Name: Tamara Lee [id number] Date: 11/15/2017  Referral from MD per RN, however patient was a follow up to be seen later. Infant with 8 percent weight loss.  Infant cuing and crying on arrival.  As infant was crying, noted Questionable tongue restriction.  Infant appears to extend tongue over gum line but does not appear to elevate well in center of tongue.  Does not appear to go past midline. Assist with positioning an latching infant. Mom reports she has only done football position and would like to do it.  Mom reports she has not been pumping or hand expressing.  Pump set up with kit in room. Attempt to hand express to show mom how and she reports that's painful.  Mom reports she always has small breasts.  Did not notice any changes during pregnancy.  Mom reports she is noting changes now.  Reports they are starting to get a little fuller.   Showed mom how to get open wide  Mouth chin in first with cheeks and chin touching breast.  Kept having to remind her to keep head tilted back a little. kept reminding her to keep hands down at his neck and  just support head.  Discussed pumping and hand expressing past breastfeeds and then feeding back all EBM.  Urged mom to massage and hand express for just a few minutes to help pump be more effective.  Urged mom to get someone else to feed back EBM if she gets anything.    Maternal Data    Feeding    LATCH Score                   Interventions    Lactation Tools Discussed/Used     Consult Status      Kerly Rigsbee Thompson Caul 11/15/2017, 3:39 PM

## 2017-11-16 ENCOUNTER — Ambulatory Visit: Payer: Self-pay

## 2017-11-16 NOTE — Lactation Note (Signed)
This note was copied from a baby's chart. Lactation Consultation Note  Patient Name: Tamara Lee [id number] Date: 11/16/2017 Reason for consult: Follow-up assessment;Infant weight loss(8% weight loss, )  Baby is 65 hours old  Per mom baby was last fed at 1030 am and has received bottles since last night.  I was fitted with a NS - #24 .  Baby is presently sound asleep.  LC reviewed LC plan of care with the use of NS and instilling EBM or formula in the  Top for an appetizer to enhance the baby getting into a feeding pattern , fed at the breast 15 -20 mins,  Supplement with at least 30 ml due to weight loss of 8%./ post pump both breast with DEBP ( Medela )  Mom denies soreness. Sore nipple and engorgement prevention and tx reviewed.  Supply and demand and the importance of consistent stimulation to protect establishing milk supply.  LC instructed mom on the use of a hand pump for pre-pumping if needed, and curved tip syringe.  I/O 's for at least 2 weeks.   LC recommended when returning to the [facility] health for children clinic this Monday to have the [facility] MD  Check the baby's tongue mobility and the LC in that office, if she is unable to check it to call for [facility] O/P appt for  F/U at [facility].  Mom also was ok with the [facility] placing a request in the [place] O/P appt. Basket to call her to set up an appt.  Mother informed of post-discharge support and given phone number to the lactation department, including services for phone call assistance; out-patient appointments; and breastfeeding support group. List of other breastfeeding resources in the community given in the handout. Encouraged mother to call for problems or concerns related to breastfeeding.    Maternal Data Formula Feeding for Exclusion: No  Feeding Feeding Type: (recently fed ) Nipple Type: Nfant Slow Flow (purple)  LATCH Score                   Interventions Interventions: Breast feeding basics  reviewed  Lactation Tools Discussed/Used Tools: Pump Nipple shield size: 24 Breast pump type: Double-Electric Breast Pump;Manual   Consult Status Consult Status: Follow-up Follow-up type: [place] 11/16/2017, 12:16 PM

## 2019-02-05 ENCOUNTER — Encounter: Payer: Self-pay | Admitting: Advanced Practice Midwife

## 2019-02-05 ENCOUNTER — Other Ambulatory Visit: Payer: Self-pay

## 2019-02-05 ENCOUNTER — Ambulatory Visit: Payer: Self-pay | Admitting: Advanced Practice Midwife

## 2019-02-05 DIAGNOSIS — B373 Candidiasis of vulva and vagina: Secondary | ICD-10-CM

## 2019-02-05 DIAGNOSIS — B3731 Acute candidiasis of vulva and vagina: Secondary | ICD-10-CM

## 2019-02-05 DIAGNOSIS — Z87410 Personal history of cervical dysplasia: Secondary | ICD-10-CM

## 2019-02-05 DIAGNOSIS — Z113 Encounter for screening for infections with a predominantly sexual mode of transmission: Secondary | ICD-10-CM

## 2019-02-05 LAB — WET PREP FOR TRICH, YEAST, CLUE: Trichomonas Exam: NEGATIVE

## 2019-02-05 MED ORDER — CLOTRIMAZOLE 1 % VA CREA: 1.0000 | TOPICAL_CREAM | Freq: Every day | VAGINAL | 0 refills | Status: AC

## 2019-02-05 NOTE — Progress Notes (Signed)
Wet Prep results reviewed. Patient treated for Yeast per standing orders. Hal Morales, RN

## 2019-02-05 NOTE — Progress Notes (Signed)
    STI clinic/screening visit  Subjective:  Tamara Lee is a 43 y.o.G3P2 nonsmoker female being seen today for an STI screening visit. The patient reports they do have symptoms.  Patient has the following medical conditions:   Patient Active Problem List   Diagnosis Date Noted  . Status post repeat low transverse cesarean section 11/13/2017  . Anemia 11/11/2017  . Encounter for cesarean delivery without indication 11/11/2017  . Cervical cerclage suture present 09/01/2017  . Uterine leiomyoma 05/09/2017  . Large for gestational age fetus affecting management of mother 05/05/2017  . Maternal age 63+, multigravida, antepartum 04/30/2017  . History of cesarean section 04/30/2017  . History of premature delivery 04/30/2017  . Thrombocytosis ([place]) 04/30/2017  . History of cervical dysplasia 09/07/2016  . Pregnancy 12/14/2015  . PROM (premature rupture of membranes) 12/14/2015     No chief complaint on file.   HPI  Patient reports external itching x 1 week  See flowsheet for further details and programmatic requirements.    The following portions of the patient's history were reviewed and updated as appropriate: allergies, current medications, past medical history, past social history, past surgical history and problem list.  Objective:  There were no vitals filed for this visit.  Physical Exam Constitutional:      Appearance: Normal appearance.  HENT:     Mouth/Throat:     Mouth: Mucous membranes are moist.  Eyes:     Conjunctiva/sclera: Conjunctivae normal.  Neck:     Musculoskeletal: Neck supple.  Pulmonary:     Effort: Pulmonary effort is normal.     Breath sounds: Normal breath sounds.  Abdominal:     Palpations: Abdomen is soft.     Comments: Soft without tenderness, fair tone  Genitourinary:    General: Normal vulva.     Exam position: Lithotomy position.     Labia:        Right: No lesion.        Left: No lesion.      Rectum: Normal.   Lymphadenopathy:     Lower Body: No right inguinal adenopathy. No left inguinal adenopathy.  Skin:    General: Skin is warm and dry.  Neurological:     Mental Status: She is alert.       Assessment and Plan:  Tamara Lee is a 43 y.o. female presenting to the [facility] Department for STI screening  1. Screening examination for venereal disease Treat wet mount per standing orders Immunization nurse consult - WET PREP FOR [place], YEAST, [place] culture  2. History of cervical dysplasia 2014 CIN on bx Per pt last pap 2019 neg [place]--ROI to obtain this please     Return if symptoms worsen or fail to improve.  No future appointments.  Tamara Lee, CNM

## 2019-02-05 NOTE — Addendum Note (Signed)
Addended by: Hal Morales A on: 02/05/2019 04:16 PM   Modules accepted: Orders

## 2019-02-10 LAB — GONOCOCCUS CULTURE

## 2019-02-16 ENCOUNTER — Telehealth: Payer: Self-pay

## 2019-02-16 NOTE — Telephone Encounter (Signed)
TC to patient. Verified ID via password/SS#. Informed of positive chlamydia and need for tx. Instructed to eat before visit and have partner call for tx appt. Appt scheduled.Dove Gresham, RN    

## 2019-02-17 ENCOUNTER — Ambulatory Visit: Payer: BLUE CROSS/BLUE SHIELD

## 2019-02-17 ENCOUNTER — Other Ambulatory Visit: Payer: Self-pay

## 2019-02-17 DIAGNOSIS — A749 Chlamydial infection, unspecified: Secondary | ICD-10-CM

## 2019-02-17 MED ORDER — AZITHROMYCIN 500 MG PO TABS
500.0000 mg | ORAL_TABLET | Freq: Once | ORAL | Status: AC
  Administered 2019-02-17: 17:00:00 1000 mg via ORAL

## 2019-02-17 NOTE — Progress Notes (Signed)
Partner card not given as client reports partner treated today in [facility]. Rich Number, RN

## 2019-10-24 IMAGING — CT CT ANGIO CHEST
2 of 7 series · 19 of 36 positions shown · IV contrast (OMNIPAQUE)
Comparison: None.

CLINICAL DATA: Shortness of breath in a pregnant patient.

EXAM:
CT ANGIOGRAPHY CHEST WITH CONTRAST
TECHNIQUE: Multidetector CT imaging of the chest was performed using the
standard protocol during bolus administration of intravenous
contrast. Multiplanar CT image reconstructions and MIPs were
obtained to evaluate the vascular anatomy.
CONTRAST:  100mL F1SSC6-D3G IOPAMIDOL (F1SSC6-D3G) INJECTION 76%

[Series 5: (person_name) thins · axial · 0.70mm/px · z∈[+781,+987]mm · 18 of 328 slices shown]
[im 17/328  lung]
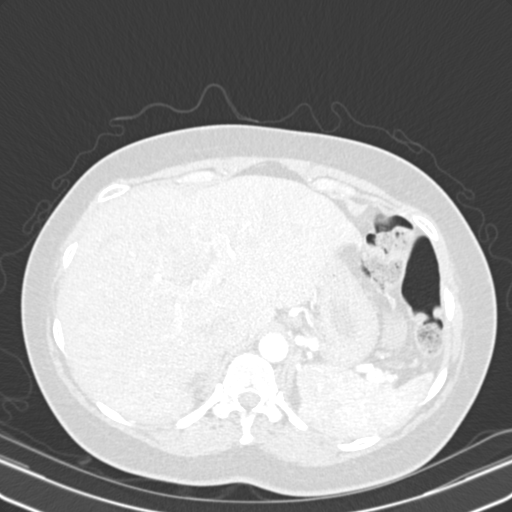
[im 33/328  mediastinal]
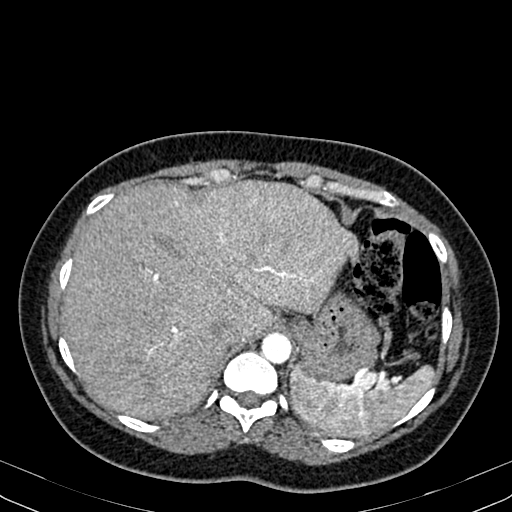
[im 50/328  lung]
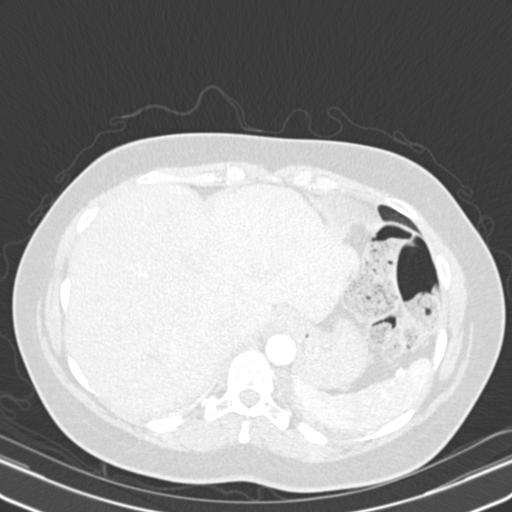
[im 66/328  mediastinal]
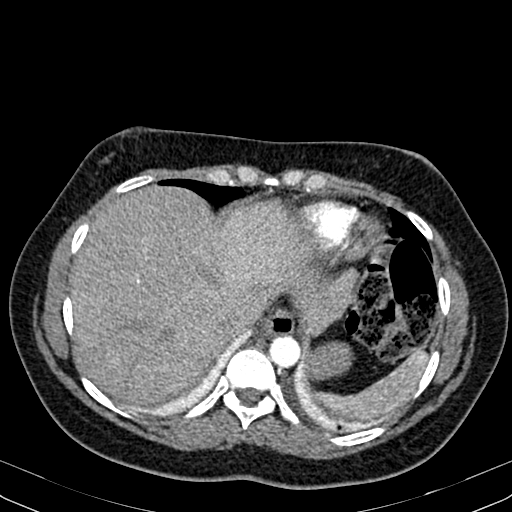
[im 82/328  lung]
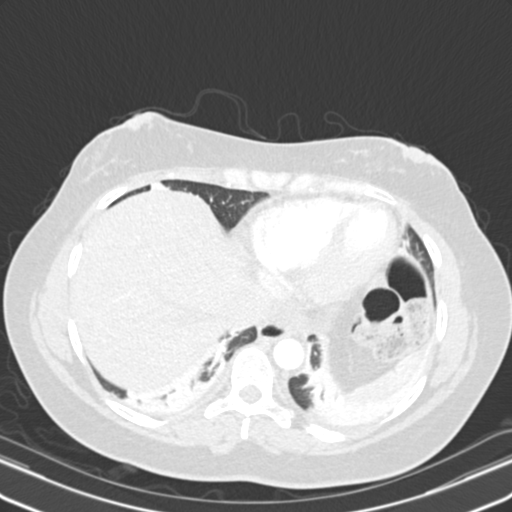
[im 99/328  mediastinal]
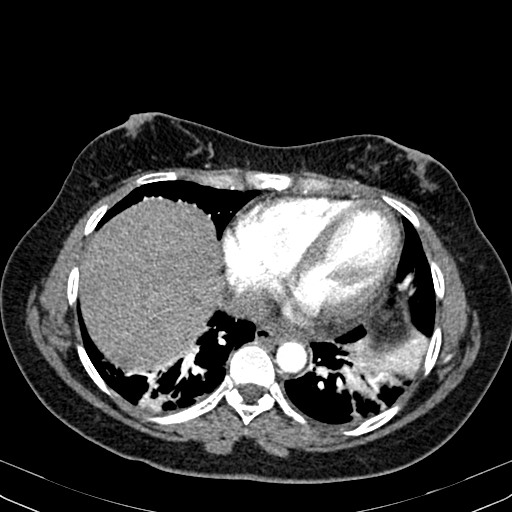
[im 115/328  lung]
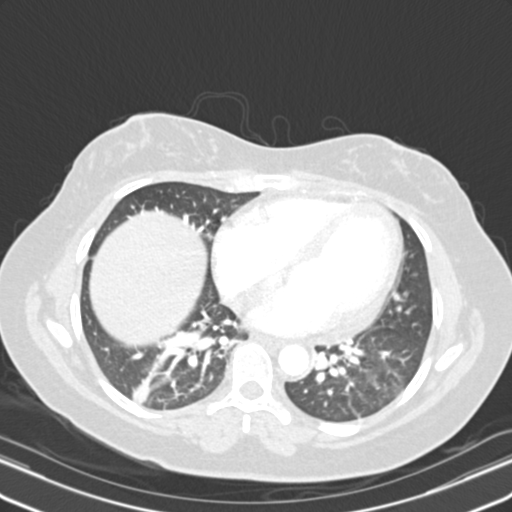
[im 131/328  mediastinal]
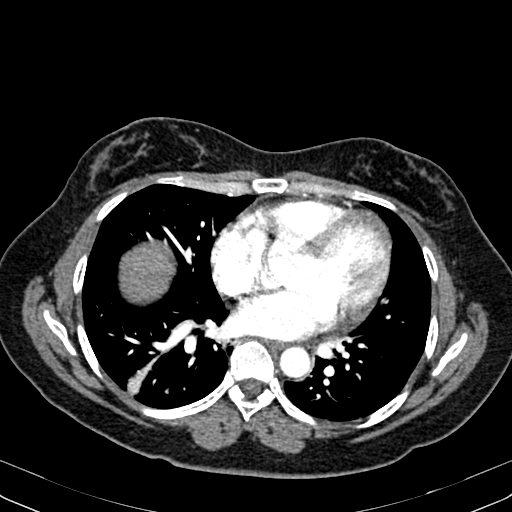
[im 148/328  lung]
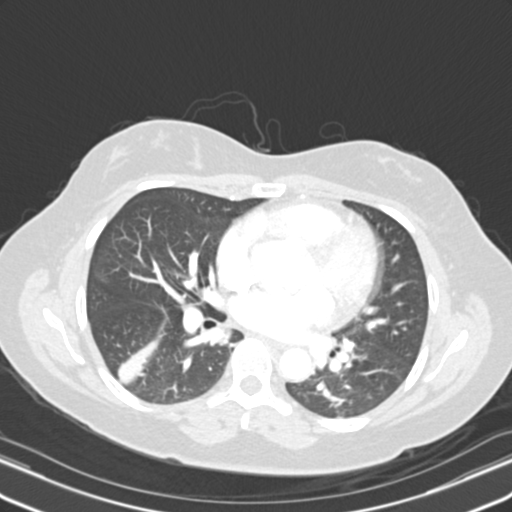
[im 180/328  mediastinal]
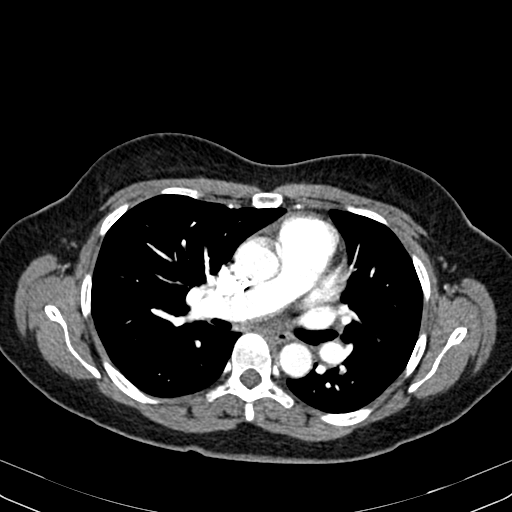
[im 197/328  lung]
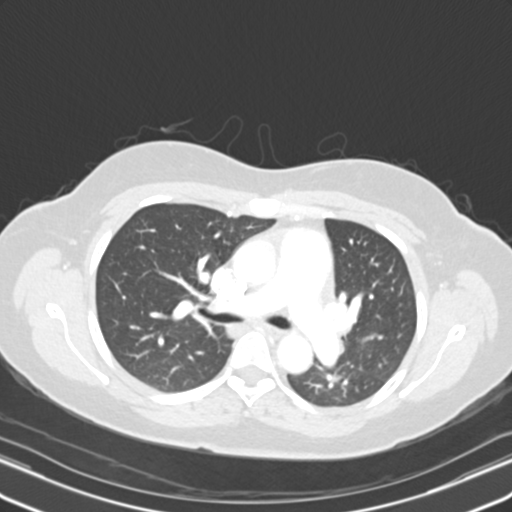
[im 213/328  mediastinal]
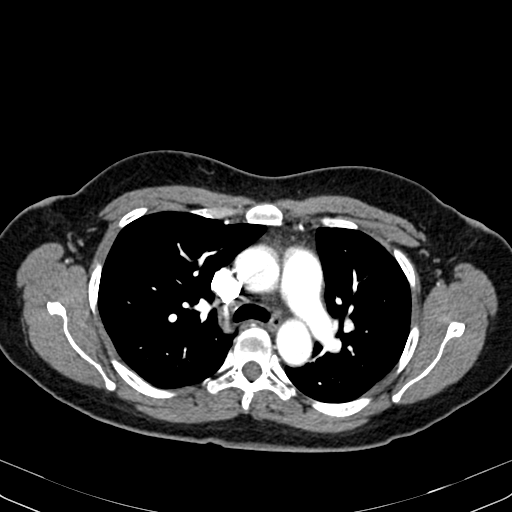
[im 229/328  lung]
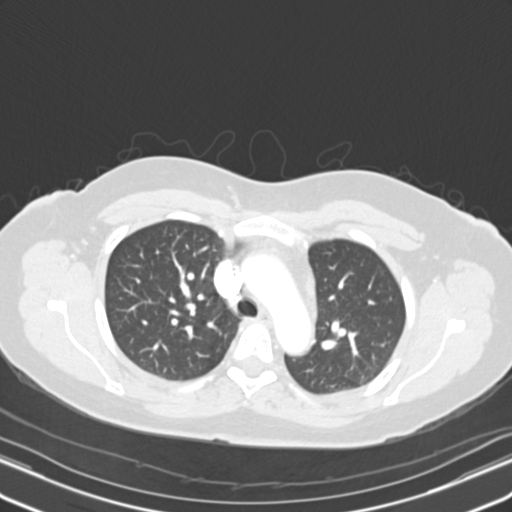
[im 246/328  mediastinal]
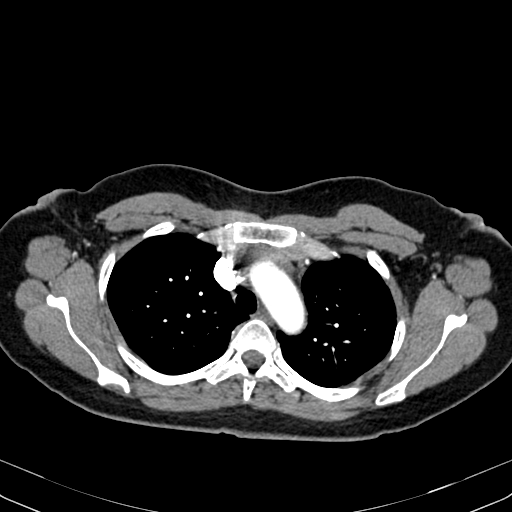
[im 262/328  lung]
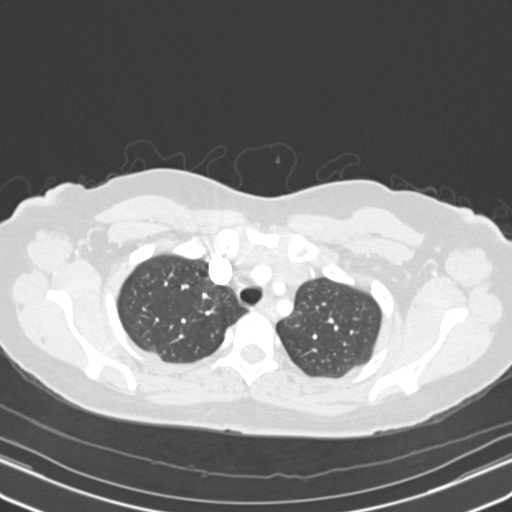
[im 278/328  mediastinal]
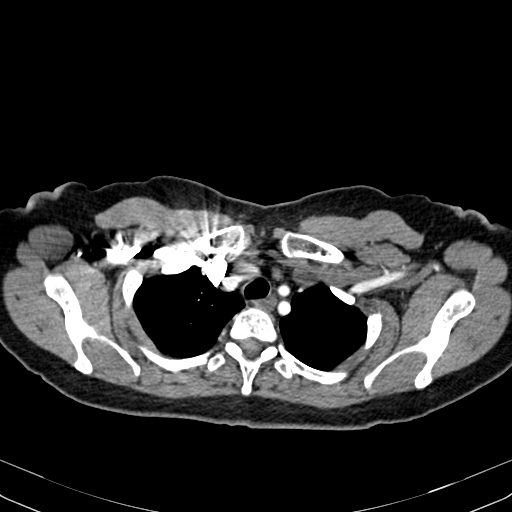
[im 295/328  lung]
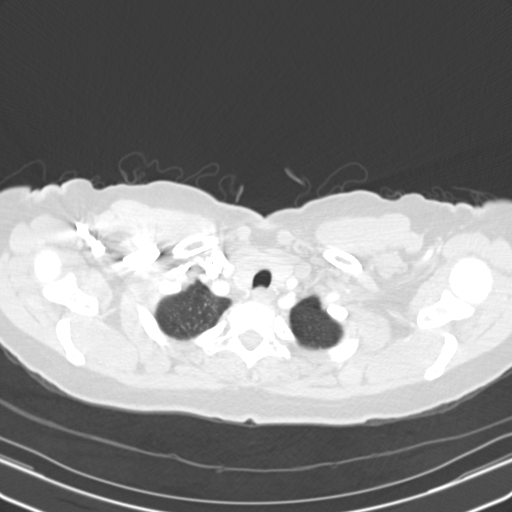
[im 311/328  mediastinal]
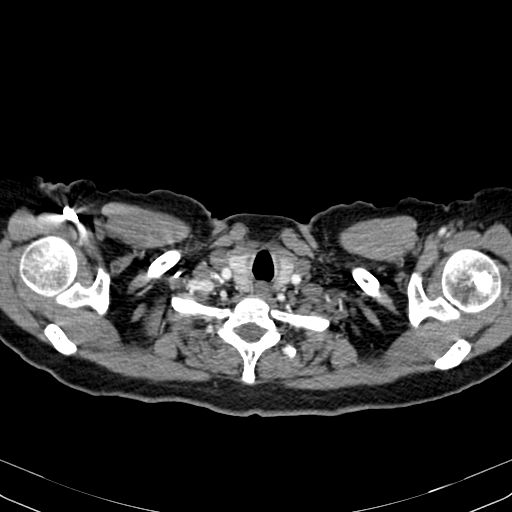

[Series 602: <mpr thick range> · coronal · 0.70mm/px · 1 of 113 slices shown]
[im 57/113  mediastinal]
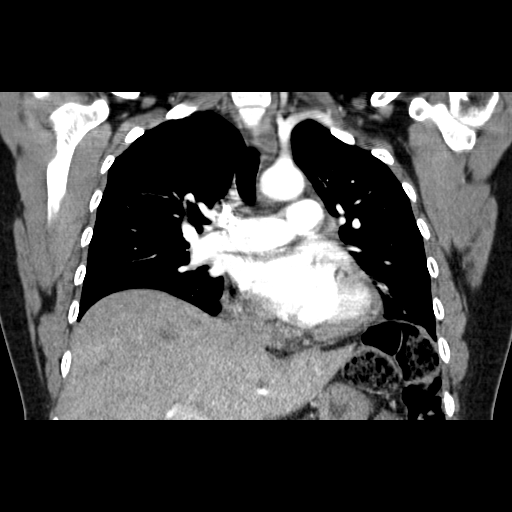

[19 of 36 positions shown; findings below may reference images not displayed]

FINDINGS: Cardiovascular: Satisfactory opacification of the pulmonary arteries
to the segmental level. No evidence of pulmonary embolism. Normal
heart size. No pericardial effusion.

Mediastinum/Nodes: No enlarged mediastinal, hilar, or axillary lymph
nodes. Thyroid gland, trachea, and esophagus demonstrate no
significant findings.

Lungs/Pleura: Streaky airspace opacities in bilateral lower lobes
and right middle lobe.

Upper Abdomen: No acute abnormality.

Musculoskeletal: No chest wall abnormality. No acute or significant
osseous findings.

Review of the MIP images confirms the above findings.
IMPRESSION: No evidence of central pulmonary embolus. The segmental branches in
the lower lobes are somewhat suboptimally evaluated due to presence
of atelectasis versus airspace disease.

## 2021-07-12 ENCOUNTER — Ambulatory Visit (LOCAL_COMMUNITY_HEALTH_CENTER): Payer: BLUE CROSS/BLUE SHIELD | Admitting: Family Medicine

## 2021-07-12 ENCOUNTER — Encounter: Payer: Self-pay | Admitting: Family Medicine

## 2021-07-12 VITALS — BP 128/71 | Ht 63.5 in | Wt 151.4 lb

## 2021-07-12 DIAGNOSIS — Z3009 Encounter for other general counseling and advice on contraception: Secondary | ICD-10-CM | POA: Diagnosis not present

## 2021-07-12 DIAGNOSIS — Z0000 Encounter for general adult medical examination without abnormal findings: Secondary | ICD-10-CM

## 2021-07-12 DIAGNOSIS — Z113 Encounter for screening for infections with a predominantly sexual mode of transmission: Secondary | ICD-10-CM

## 2021-07-12 LAB — WET PREP FOR TRICH, YEAST, CLUE
Trichomonas Exam: NEGATIVE
Yeast Exam: NEGATIVE

## 2021-07-12 LAB — HM HIV SCREENING LAB: HM HIV Screening: NEGATIVE

## 2021-07-12 NOTE — Progress Notes (Signed)
[facility] ?[place] ?Main Number: [phone] ? ? ? ?Family Planning Visit- Initial Visit ? ?Subjective:  ?Tamara Lee is a 45 y.o.  G3P2002   being seen today for an initial annual visit and to discuss reproductive life planning.  The patient is currently using No Method - No Contraceptive Precautions for pregnancy prevention. Patient reports she/her/hers  does not want a pregnancy in the next year.   ? ?she/her/hers report they are looking for a method that provides Discrete method, Long term method, and Methods that does not involve too much memory ? ?Patient has the following medical conditions has Pregnancy; PROM (premature rupture of membranes); History of cervical dysplasia; Maternal age 58+, multigravida, antepartum; Cervical cerclage suture present; Large for gestational age fetus affecting management of mother; Anemia; History of cesarean section; History of premature delivery; Thrombocytosis; Uterine leiomyoma; Encounter for cesarean delivery without indication; and Status post repeat low transverse cesarean section on their problem list. ? ?Chief Complaint  ?Patient presents with  ? Annual Exam  ?  Pap smear  ? ? ?Patient reports here for physical, STI testing and discuss BCM and burning in vagina with arousal  ? ?Patient denies another concerns.  ? ?Body mass index is 26.4 kg/m?. - Patient is eligible for diabetes screening based on BMI and age >57? No  ?HA1C ordered? no ? ?Patient reports 1  partner/s in last year. Desires STI screening?  Yes ? ?Has patient been screened once for HCV in the past?  No ? No results found for: HCVAB ? ?Does the patient have current drug use (including MJ), have a partner with drug use, and/or has been incarcerated since last result? No  ?If yes-- Screen for HCV through [facility] ?  ?Does the patient meet criteria for HBV testing? Yes ? ?Criteria:  ?-Household, sexual or needle sharing contact  with HBV ?-History of drug use ?-HIV positive ?-Those with known Hep C ? ? ?Health Maintenance Due  ?Topic Date Due  ? COVID-19 Vaccine (1) Never done  ? Hepatitis C Screening  Never done  ? COLONOSCOPY (Pts 45-23yr Insurance coverage will need to be confirmed)  Never done  ? INFLUENZA VACCINE  Never done  ? ? ?Review of Systems  ?Constitutional:  Negative for chills, fever, malaise/fatigue and weight loss.  ?HENT:  Negative for congestion, hearing loss and sore throat.   ?Eyes:  Negative for blurred vision, double vision and photophobia.  ?Respiratory:  Negative for shortness of breath.   ?Cardiovascular:  Negative for chest pain.  ?Gastrointestinal:  Negative for abdominal pain, blood in stool, constipation, diarrhea, heartburn, nausea and vomiting.  ?Genitourinary:  Negative for dysuria and frequency.  ?Musculoskeletal:  Negative for back pain, joint pain and neck pain.  ?Skin:  Negative for itching and rash.  ?Neurological:  Negative for dizziness, weakness and headaches.  ?Endo/Heme/Allergies:  Does not bruise/bleed easily.  ?Psychiatric/Behavioral:  Negative for depression, substance abuse and suicidal ideas.   ? ?The following portions of the patient's history were reviewed and updated as appropriate: allergies, current medications, past family history, past medical history, past social history, past surgical history and problem list. Problem list updated. ? ? ?See flowsheet for other program required questions. ? ?Objective:  ? ?Vitals:  ? 07/12/21 1431  ?BP: 128/71  ?Weight: 151 lb 6.4 oz (68.7 kg)  ?Height: 5' 3.5" (1.613 m)  ? ? ?Physical Exam ?Vitals and nursing note reviewed.  ?Constitutional:   ?  Appearance: Normal appearance.  ?HENT:  ?   Head: Normocephalic and atraumatic.  ?   Mouth/Throat:  ?   Mouth: Mucous membranes are moist.  ?   Dentition: Normal dentition. No dental caries.  ?   Pharynx: No oropharyngeal exudate or posterior oropharyngeal erythema.  ?Eyes:  ?   General: No scleral  icterus. ?Neck:  ?   Thyroid: No thyroid mass, thyromegaly or thyroid tenderness.  ?Cardiovascular:  ?   Rate and Rhythm: Normal rate and regular rhythm.  ?   Pulses: Normal pulses.  ?   Heart sounds: Normal heart sounds.  ?Pulmonary:  ?   Effort: Pulmonary effort is normal.  ?   Breath sounds: Normal breath sounds.  ?Chest:  ?   Comments: Breasts:  ?      Right: Normal. No swelling, mass, nipple discharge, skin change or tenderness.  ?      Left: Normal. No swelling, mass, nipple discharge, skin change or tenderness.   ?Abdominal:  ?   General: Abdomen is flat. Bowel sounds are normal.  ?   Palpations: Abdomen is soft.  ?Genitourinary: ?   General: Normal vulva.  ?   Rectum: Normal.  ?   Comments: External genitalia without, lice, nits, erythema, edema , lesions or inguinal adenopathy. Vagina with normal mucosa and discharge and pH equals 4.  Cervix without visual lesions, uterus firm, mobile, non-tender, no masses, CMT adnexal fullness or tenderness.  ? ?Musculoskeletal:     ?   General: Normal range of motion.  ?   Cervical back: Normal range of motion and neck supple.  ?Lymphadenopathy:  ?   Cervical: No cervical adenopathy.  ?Skin: ?   General: Skin is warm and dry.  ?Neurological:  ?   General: No focal deficit present.  ?   Mental Status: She is alert and oriented to person, place, and time.  ?Psychiatric:     ?   Mood and Affect: Mood normal.     ?   Behavior: Behavior normal.  ? ? ? ? ?Assessment and Plan:  ?Tamara Lee is a 46 y.o. female presenting to the [facility] for an initial annual wellness/contraceptive visit ? ? ?1. Routine general medical examination at a health care facility ?Well woman exam  ?CBE today  ?Pap due 01/2026 ?Discussed about menopause s/sx-  written information given.   ?Discussed having vaginal dryness and methods th help with dryness.  ?Pt to check with insurance to see what OBGYN office can take her insurance for follow up on vaginal burning with  arousal.  Pt to contact clinic for referral.   ? ?STI testing  to rule out infections  ?Differential dx- vulvodynia  ? ?2. Family planning counseling ?Contraception counseling: Reviewed options based on patient desire and reproductive life plan. Patient is interested in Hormonal Implant and IUD or IUS. This was not provided to the patient today. Pt wants information and  think about which device she wants.   ? ?Risks, benefits, and typical effectiveness rates were reviewed.  Questions were answered.  Written information was also given to the patient to review.   ? ?The patient will follow up in  2 weeks for surveillance.  The patient was told to call with any further questions, or with any concerns about this method of contraception.  Emphasized use of condoms 100% of the time for STI prevention. ? ?Need for ECP was assessed. Patient reported > 30 days ago. Reviewed options and patient desired  No method of ECP, declined all   ? ?3. Screening examination for venereal disease ?Patient accepted all screenings including wet prep,vaginal CT/GC and bloodwork for HIV/RPR.  ? ?Wet prep results neg    ?Treatment needed ?Discussed time line for [facility] results and that patient will be called with positive results and encouraged patient to call if she had not heard in 2 weeks.  ?Counseled to return or seek care for continued or worsening symptoms ?Recommended condom use with all sex ? ? ?- Chlamydia/Gonorrhea [facility] ?- HIV [facility] ?- WET PREP FOR [place], YEAST, CLUE ?- Syphilis Serology, [facility] ?- Chlamydia/Gonorrhea [facility] ? ? ?Return in about 1 year (around 07/13/2022) for annual well woman exam. ? ?No future appointments. ? ?Junious Dresser, FNP ? ?

## 2021-07-12 NOTE — Progress Notes (Signed)
Patient here for PE, STD testing. Wet prep reviewed. No tx per standing orders. Condoms declined. PCP list given to patient.  ?
